# Patient Record
Sex: Female | Born: 1965 | Hispanic: No | Marital: Married | Smoking: Never smoker
Health system: Southern US, Community
[De-identification: ages and names within clinical notes are randomized; demographics above are authoritative.]

## PROBLEM LIST (undated history)

## (undated) DIAGNOSIS — D649 Anemia, unspecified: Secondary | ICD-10-CM

## (undated) DIAGNOSIS — J45909 Unspecified asthma, uncomplicated: Secondary | ICD-10-CM

## (undated) DIAGNOSIS — D500 Iron deficiency anemia secondary to blood loss (chronic): Principal | ICD-10-CM

## (undated) DIAGNOSIS — N812 Incomplete uterovaginal prolapse: Secondary | ICD-10-CM

## (undated) DIAGNOSIS — D219 Benign neoplasm of connective and other soft tissue, unspecified: Secondary | ICD-10-CM

## (undated) DIAGNOSIS — M25461 Effusion, right knee: Secondary | ICD-10-CM

## (undated) DIAGNOSIS — N814 Uterovaginal prolapse, unspecified: Secondary | ICD-10-CM

## (undated) HISTORY — DX: Uterovaginal prolapse, unspecified: N81.4

## (undated) HISTORY — DX: Incomplete uterovaginal prolapse: N81.2

## (undated) HISTORY — PX: NO PAST SURGERIES: SHX2092

## (undated) HISTORY — DX: Iron deficiency anemia secondary to blood loss (chronic): D50.0

## (undated) HISTORY — DX: Effusion, right knee: M25.461

## (undated) HISTORY — DX: Benign neoplasm of connective and other soft tissue, unspecified: D21.9

## (undated) HISTORY — DX: Unspecified asthma, uncomplicated: J45.909

## (undated) HISTORY — DX: Anemia, unspecified: D64.9

---

## 2004-03-17 ENCOUNTER — Ambulatory Visit: Payer: Self-pay

## 2004-04-19 ENCOUNTER — Ambulatory Visit: Payer: Self-pay

## 2004-05-26 ENCOUNTER — Ambulatory Visit: Payer: Self-pay

## 2007-11-15 ENCOUNTER — Observation Stay: Payer: Self-pay | Admitting: Obstetrics and Gynecology

## 2007-11-16 ENCOUNTER — Inpatient Hospital Stay: Payer: Self-pay

## 2009-09-03 ENCOUNTER — Ambulatory Visit: Payer: Self-pay | Admitting: Internal Medicine

## 2009-12-29 ENCOUNTER — Ambulatory Visit: Payer: Self-pay | Admitting: Internal Medicine

## 2010-09-27 ENCOUNTER — Ambulatory Visit: Payer: Self-pay | Admitting: Obstetrics and Gynecology

## 2010-12-23 ENCOUNTER — Emergency Department: Payer: Self-pay | Admitting: Emergency Medicine

## 2011-09-18 ENCOUNTER — Other Ambulatory Visit: Payer: Self-pay | Admitting: Obstetrics and Gynecology

## 2011-09-18 LAB — HCG, QUANTITATIVE, PREGNANCY: Beta Hcg, Quant.: 843 m[IU]/mL — ABNORMAL HIGH

## 2011-09-18 LAB — URINALYSIS, COMPLETE
Bilirubin,UR: NEGATIVE
Glucose,UR: NEGATIVE mg/dL (ref 0–75)
Ketone: NEGATIVE
Squamous Epithelial: 3
WBC UR: 1 /HPF (ref 0–5)

## 2011-09-20 ENCOUNTER — Other Ambulatory Visit: Payer: Self-pay | Admitting: Obstetrics and Gynecology

## 2011-09-20 LAB — HCG, QUANTITATIVE, PREGNANCY: Beta Hcg, Quant.: 317 m[IU]/mL — ABNORMAL HIGH

## 2011-10-04 ENCOUNTER — Other Ambulatory Visit: Payer: Self-pay | Admitting: Obstetrics and Gynecology

## 2011-10-04 LAB — HCG, QUANTITATIVE, PREGNANCY: Beta Hcg, Quant.: 2 m[IU]/mL

## 2014-09-15 ENCOUNTER — Encounter: Payer: Self-pay | Admitting: *Deleted

## 2015-07-20 DIAGNOSIS — Z0000 Encounter for general adult medical examination without abnormal findings: Secondary | ICD-10-CM | POA: Diagnosis not present

## 2015-07-20 DIAGNOSIS — K5900 Constipation, unspecified: Secondary | ICD-10-CM | POA: Diagnosis not present

## 2015-07-20 DIAGNOSIS — D500 Iron deficiency anemia secondary to blood loss (chronic): Secondary | ICD-10-CM | POA: Diagnosis not present

## 2015-07-20 DIAGNOSIS — E785 Hyperlipidemia, unspecified: Secondary | ICD-10-CM | POA: Diagnosis not present

## 2015-08-18 ENCOUNTER — Ambulatory Visit: Payer: Self-pay | Admitting: Physician Assistant

## 2015-08-18 ENCOUNTER — Other Ambulatory Visit: Payer: Self-pay | Admitting: Physician Assistant

## 2015-08-18 ENCOUNTER — Encounter: Payer: Self-pay | Admitting: Physician Assistant

## 2015-08-18 ENCOUNTER — Ambulatory Visit
Admission: RE | Admit: 2015-08-18 | Discharge: 2015-08-18 | Disposition: A | Discharge status: Home / Self Care | Payer: 59 | Source: Ambulatory Visit | Attending: Physician Assistant | Admitting: Physician Assistant

## 2015-08-18 VITALS — BP 150/90 | HR 84 | Temp 99.1°F

## 2015-08-18 DIAGNOSIS — M79604 Pain in right leg: Secondary | ICD-10-CM

## 2015-08-18 DIAGNOSIS — M7989 Other specified soft tissue disorders: Secondary | ICD-10-CM | POA: Diagnosis not present

## 2015-08-18 MED ORDER — IBUPROFEN 600 MG PO TABS: 600.0000 mg | ORAL_TABLET | Freq: Three times a day (TID) | ORAL | Status: DC | PRN

## 2015-08-18 NOTE — Progress Notes (Signed)
S: c/o r knee swelling, sx for 3 weeks, but now r lower leg has started hurting too, no swelling in leg but is having pain in back of calf, worried bc she has been driving 1hour a day both ways to help with grandchildren, took otc ibuprofen which helped knee pain but not leg pain, denies feeling sick, knee has not been red or hot to touch, no known injury; nonsmoker, no bcp or hormone replacement therapy  O: vitals wnl, nad, r knee with swelling at medial aspect, full rom, ligaments appear intact, r calf and posterior knee tender to palpation in center where dvt would be located, n/v intact  A: acute knee and leg pain  P: [place] r lower leg stat, if neg use ibuprofen 600mg  tid with food for pain, gave pt neoprene sleeve

## 2015-08-30 DIAGNOSIS — M25561 Pain in right knee: Secondary | ICD-10-CM | POA: Diagnosis not present

## 2015-08-30 DIAGNOSIS — M25461 Effusion, right knee: Secondary | ICD-10-CM | POA: Diagnosis not present

## 2015-09-01 DIAGNOSIS — S83421A Sprain of lateral collateral ligament of right knee, initial encounter: Secondary | ICD-10-CM | POA: Diagnosis not present

## 2015-09-01 DIAGNOSIS — M1711 Unilateral primary osteoarthritis, right knee: Secondary | ICD-10-CM | POA: Diagnosis not present

## 2015-09-02 ENCOUNTER — Ambulatory Visit: Payer: 59 | Attending: Student | Admitting: Physical Therapy

## 2015-09-02 DIAGNOSIS — R2689 Other abnormalities of gait and mobility: Secondary | ICD-10-CM | POA: Insufficient documentation

## 2015-09-02 DIAGNOSIS — M25669 Stiffness of unspecified knee, not elsewhere classified: Secondary | ICD-10-CM

## 2015-09-02 DIAGNOSIS — M24669 Ankylosis, unspecified knee: Secondary | ICD-10-CM | POA: Diagnosis not present

## 2015-09-02 NOTE — Patient Instructions (Signed)
All exercises provided were adapted from [email or web address]. Patient was provided a written handout with pictures as described. Any additional cues were manually entered in to handout and copied in to this document.  GREAT TOE EXTENSION STRETCH  While seated, cross your legs so that the affected leg is on top.   Next, bend your big toe back with your fingers until a stretch is felt in your toe and or bottom of your foot.   SEATED CALF STRETCH - GASTROC  While sitting, use a towel or other strap looped around your foot. Gently pull your ankle back until a stretch is felt along the back of your lower leg.   Prone Quadriceps Stretch  Lie on your stomach; hook a strap around your foot/ankle.  Gently pull your heel towards your bottom until a stretch is felt in the front of the thigh.  Keep your back relaxed and your hips flat on the table.

## 2015-09-03 NOTE — Therapy (Signed)
[facility] [address], [place], [address] Phone: [phone]   Fax:  [phone]  Physical Therapy Evaluation  Patient Details  Name: Connie Santiago MRN: [id number] Date of Birth: 1965/08/06 No Data Recorded  Encounter Date: 09/02/2015      PT End of Session - 09/03/15 2055    Visit Number 1   Number of Visits 13   Date for PT Re-Evaluation 10/22/15   PT Start Time [id number]   PT Stop Time 1615   PT Time Calculation (min) 60 min   Activity Tolerance Patient tolerated treatment well   Behavior During Therapy [facility] for tasks assessed/performed      No past medical history on file.  No past surgical history on file.  There were no vitals filed for this visit.  Visit Diagnosis:  Decreased range of motion (ROM) of knee - Plan: PT plan of care cert/re-cert  Other abnormalities of gait and mobility - Plan: PT plan of care cert/re-cert      Subjective Assessment - 09/02/15 1640    Subjective Patient reports she began limping roughly 5-6 weeks ago and noticed swelling in her suprapatellar region. She denies any pain in her knee, though notes her gait is still off. She does no recall any MOI.    Pertinent History Patient reports she may have had a fall out of bed or lifted a patient the wrong way several weeks ago. X-ray and [place] have been negative thus far. She reports she had some foot pain recently after changing shoes.    Limitations Walking   Diagnostic tests X-ray (notable for OA), [place] - negative for DVT    Patient Stated Goals To return to her walking around the track   Currently in Pain? No/denies            [facility] PT Assessment - 09/02/15 1627    Assessment   Medical Diagnosis --  LCL tear, OA   Referring Provider --  Cameron Proud   Precautions   Precautions None   Required Braces or Orthoses --  She was issued a brace, no comments on need?   Restrictions   Weight Bearing Restrictions  No   Balance Screen   Has the patient fallen in the past 6 months No  She reports she may have had a fall out of bed?    Prior Function   Level of Independence Independent   Vocation Full time employment   Vocation Requirements --  CCU RN   Leisure --  Likes walking on a track   Cognition   Overall Cognitive Status Within Functional Limits for tasks assessed   Observation/Other Assessments   Lower Extremity Functional Scale  56   Observation/Other Assessments-Edema    Edema --  Palpable swelling on the suprapatellar region, could not mea   Sensation   Light Touch Appears Intact   Functional Tests   Functional tests Squat;Sit to Stand   Sit to Stand   Comments --  Initially takes weight off, some valgus collapse noted cuing   Strength   Right Hip Flexion 5/5   Right Hip ABduction 4-/5   Right Hip ADduction 3+/5   Left Hip Flexion 5/5   Left Hip ABduction 5/5   Left Hip ADduction 5/5   Right Knee Flexion 5/5   Right Knee Extension 5/5   Left Knee Flexion 5/5   Left Knee Extension 5/5   Right Ankle Dorsiflexion 5/5   Right  Ankle Plantar Flexion 5/5   Right Ankle Inversion 5/5   Right Ankle Eversion 5/5   Left Ankle Dorsiflexion 5/5   Left Ankle Plantar Flexion 5/5   Left Ankle Inversion 5/5   Left Ankle Eversion 5/5   Palpation   Patella mobility --  Decreased ROM medial/lateral with mild reproduction of sympt   Palpation comment --  No tenderness discernable in adductors, quads, IT band   Slump test   Findings Negative   Ely's Test   Findings Positive   Side Right   Hip Scouring   Findings Negative   Ambulation/Gait   Gait Pattern --  Lateral whip, decreased great toe extension/DF, no TKE     TherEx Prone stretching of knee extensors in Ely's position 3 sets x 10 repetitions for 5" holds -- patient reported significant improvement in gait pattern sensation  Medial patellar mobilizaitons 3 bouts x 10 repetitions (again patient reported significant  improvement in gait sensation)  Great toe/dorsiflexor mobilizations by PT 3 bouts x 1' with decreased feeling of stiffness in gait  Self ankle/great toe DF mobilizations x 2 bouts for 1', patient felt worse than PT mobilizations provided.                       PT Education - 09/02/15 1626    Education provided Yes   Education Details HEP, RICE over the weekend, activity modification.    Person(s) Educated Patient   Methods Explanation;Demonstration   Comprehension Verbalized understanding;Returned demonstration             PT Long Term Goals - 09/03/15 2048    PT LONG TERM GOAL #1   Title Patient will report she is able to return to full walking program she has started to return to fitness activities.    Baseline Unable due to discomfort with ambulation.    Time 6   Period Weeks   Status New   PT LONG TERM GOAL #2   Title Patient will demonstrate 5/5 MMT strength in all directions bilaterally to reduce stress on knee joint in ADLs.    Time 6   Period Weeks   Status New   PT LONG TERM GOAL #3   Title Patient will report an LEFS score of greater than 65/80 to demonstrate improved ability to complete ADLs.    Baseline 56/80   Time 6   Period Weeks   Status New               Plan - 09/02/15 1623    Clinical Impression Statement Patient reports acute onset of distorted gait pattern and swelling in her R knee. She recalls no MOI and on this exam does not demonstrate any laxity of the knee as assessed by this provider. She does demonstrate decreased ROM around her knee into flexion, decreased paterllar mobility, and weakness around her hip. She would benefit from skilled PT services to treat identified impairments to return to normal activities.    Pt will benefit from skilled therapeutic intervention in order to improve on the following deficits Abnormal gait;Decreased range of motion   Rehab Potential Good   Clinical Impairments Affecting Rehab  Potential Acute onset, no pain, quite active at baseline.    PT Frequency 2x / week   PT Duration 6 weeks   PT Treatment/Interventions Aquatic Therapy;Cryotherapy;Taping;Manual techniques;Therapeutic exercise;Therapeutic activities;Gait training   PT Next Visit Plan Assess Ely's test, mensical testing, joint laxity, increase ankle DF ROM, great toe extension, knee  flexion mobilizations    PT Home Exercise Plan See patient instructions.    Consulted and Agree with Plan of Care Patient         Problem List There are no active problems to display for this patient.  Kerman Passey, PT, DPT    09/03/2015, 8:57 PM  [place] [address] [place], [address] Phone: [phone]   Fax:  [phone]  Name: ELENOA SPRAGGINS MRN: [id number] Date of Birth: 02/14/1966

## 2015-09-07 ENCOUNTER — Ambulatory Visit: Payer: 59 | Attending: Student | Admitting: Physical Therapy

## 2015-09-07 DIAGNOSIS — R2689 Other abnormalities of gait and mobility: Secondary | ICD-10-CM | POA: Diagnosis not present

## 2015-09-07 DIAGNOSIS — M25669 Stiffness of unspecified knee, not elsewhere classified: Secondary | ICD-10-CM

## 2015-09-07 DIAGNOSIS — M24669 Ankylosis, unspecified knee: Secondary | ICD-10-CM | POA: Diagnosis not present

## 2015-09-07 NOTE — Therapy (Signed)
[facility] [address] [place], [address] Phone: [phone]   Fax:  [phone]  Physical Therapy Treatment  Patient Details  Name: LEKSI HERTZBERG MRN: [id number] Date of Birth: 1966/05/13 No Data Recorded  Encounter Date: 09/07/2015      PT End of Session - 09/07/15 0850    Visit Number 2   Number of Visits 13   Date for PT Re-Evaluation 10/22/15   PT Start Time 0800   PT Stop Time 0845   PT Time Calculation (min) 45 min   Activity Tolerance Patient tolerated treatment well   Behavior During Therapy [facility] for tasks assessed/performed      No past medical history on file.  No past surgical history on file.  There were no vitals filed for this visit.  Visit Diagnosis:  Decreased range of motion (ROM) of knee  Other abnormalities of gait and mobility      Subjective Assessment - 09/07/15 0827    Subjective Patient reports she was able to go up stair yesterday which she had not been able to do, she reports she is still having swelling (though some decrease). She reports less stiffness without the brace on, increased stiffness/discomfort with brace on.    Pertinent History Patient reports she may have had a fall out of bed or lifted a patient the wrong way several weeks ago. X-ray and [place] have been negative thus far. She reports she had some foot pain recently after changing shoes.    Limitations Walking   Diagnostic tests X-ray (notable for OA), [place] - negative for DVT    Patient Stated Goals To return to her walking around the track   Currently in Pain? No/denies  Feeling of stretching in posterior and anterior knee.      Mobilizations over bolster 5 bounds with increasing knee flexion she reports some stretching sensation with progressive knee flexion  Quad sets with knee extension mobs 5 bouts x 30" (unable to achieve full ROM) patient reported significant difference side to side,  pain/discomfort noted with end range knee extension.   Figure 4 positioning in standing (PT assisting for active HS activity to increase knee flexion ROM) 3 bouts x 15 repetitions  Standing TKE with over pressure 2 bouts x 10 repetitions with PT providing overpressure (well tolerated)  Gait assessment -- decreased medial whip, appears to be related to decreased knee flexion in swing phase. Improved with cuing for increased knee flexion, patient reported this was the best her walking had felt in weeks.   Standing calf raises -- 2 sets of 12 repetitions with HHA (had noticed some atrophy in LLE calf musculature)                            PT Education - 09/07/15 0849    Education provided Yes   Education Details HEP including increased knee flexion/extension. Increased knee flexion inambulaiton.    Person(s) Educated Patient   Methods Explanation;Demonstration;Handout   Comprehension Returned demonstration;Verbalized understanding             PT Long Term Goals - 09/03/15 2048    PT LONG TERM GOAL #1   Title Patient will report she is able to return to full walking program she has started to return to fitness activities.    Baseline Unable due to discomfort with ambulation.    Time 6   Period Weeks   Status  New   PT LONG TERM GOAL #2   Title Patient will demonstrate 5/5 MMT strength in all directions bilaterally to reduce stress on knee joint in ADLs.    Time 6   Period Weeks   Status New   PT LONG TERM GOAL #3   Title Patient will report an LEFS score of greater than 65/80 to demonstrate improved ability to complete ADLs.    Baseline 56/80   Time 6   Period Weeks   Status New               Plan - 09/07/15 [id number]    Clinical Impression Statement Patient demonstrates increased knee flexion once cued to increase during session. After mobilizations and active HS/qaudricep activity in standing she reports her ambulaiton feels the best it has in  weeks. She continues to have suprapatellar swelling, though this appears to be decreasing as well. She continues to demonstrate decreased ROM around her knee both actively and passively.   Pt will benefit from skilled therapeutic intervention in order to improve on the following deficits Abnormal gait;Decreased range of motion   Rehab Potential Good   Clinical Impairments Affecting Rehab Potential Acute onset, no pain, quite active at baseline.    PT Frequency 2x / week   PT Duration 6 weeks   PT Treatment/Interventions Aquatic Therapy;Cryotherapy;Taping;Manual techniques;Therapeutic exercise;Therapeutic activities;Gait training   PT Next Visit Plan Assess Ely's test, mensical testing, joint laxity, increase ankle DF ROM, great toe extension, knee flexion mobilizations    PT Home Exercise Plan See patient instructions.    Consulted and Agree with Plan of Care Patient        Problem List There are no active problems to display for this patient.  Kerman Passey, PT, DPT    09/07/2015, 8:32 PM  [place] [place] [facility] [address] [place], [address] Phone: [phone]   Fax:  [phone]  Name: VANI BORGESE MRN: [id number] Date of Birth: 10-16-1965

## 2015-09-14 ENCOUNTER — Ambulatory Visit: Payer: 59 | Admitting: Physical Therapy

## 2015-09-14 DIAGNOSIS — R2689 Other abnormalities of gait and mobility: Secondary | ICD-10-CM

## 2015-09-14 DIAGNOSIS — M24669 Ankylosis, unspecified knee: Secondary | ICD-10-CM | POA: Diagnosis not present

## 2015-09-14 DIAGNOSIS — M25669 Stiffness of unspecified knee, not elsewhere classified: Secondary | ICD-10-CM

## 2015-09-14 NOTE — Therapy (Signed)
[facility] [address], [place], [address] Phone: [phone]   Fax:  [phone]  Physical Therapy Treatment  Patient Details  Name: Connie Santiago MRN: [id number] Date of Birth: Nov 12, 1965 No Data Recorded  Encounter Date: 09/14/2015      PT End of Session - 09/14/15 0847    Visit Number 3   Number of Visits 13   Date for PT Re-Evaluation 10/22/15   PT Start Time 0801   PT Stop Time [id number]   PT Time Calculation (min) 43 min   Activity Tolerance Patient tolerated treatment well   Behavior During Therapy [facility] for tasks assessed/performed      No past medical history on file.  No past surgical history on file.  There were no vitals filed for this visit.      Subjective Assessment - 09/14/15 0803    Subjective Patient reports she had some transient pain on the medial side of her R knee on Sunday morning when she woke up. Patient reports she feels much better, reduced swelling noted. Reports she has less "catching" sensation in her knee.    Pertinent History Patient reports she may have had a fall out of bed or lifted a patient the wrong way several weeks ago. X-ray and [place] have been negative thus far. She reports she had some foot pain recently after changing shoes.    Limitations Walking   Diagnostic tests X-ray (notable for OA), [place] - negative for DVT    Patient Stated Goals To return to her walking around the track   Currently in Pain? No/denies      Rock backs to activate quad 2 rounds x 15 repetitions with patient reporting she is able to feel quadricep activating.   Leg press 15, 10, 5 (unable to tolerate higher weights) for 10 repetitions at 10# and 5# on RLE only (patient required max cuing to not provide movement from lumbar spine, just from knee flexion/extension.   LAQs (only felt in inferior patellar region) attempted with ankle weight, against manual resistance, and against  bodyweight.   Standing TKEs - with red theraband to promote knee extensor activation. Compared sides, R side shows decreased definition (either from atrophy or swelling) 2 bouts x 15 repetitions (able to provide light quadricep activation.   Prone knee flexion manual stretching bilaterally for 3 bouts x 8 repetitions for 15"   SLRs - able to feel quad contraction. Able to complete 10 repetitions. On RLE.   Gait assessment- patient continues to demonstrate decreased knee flexion to initiate swing phase and is unable to land in knee extension due to quad weakness currently.                            PT Education - 09/14/15 [phone]    Education provided Yes   Education Details Need to re-activate quadriceps, HEP to include SLRs and rock backs which activated her quad.    Person(s) Educated Patient   Methods Explanation;Demonstration;Handout   Comprehension Verbalized understanding;Returned demonstration             PT Long Term Goals - 09/03/15 2048    PT LONG TERM GOAL #1   Title Patient will report she is able to return to full walking program she has started to return to fitness activities.    Baseline Unable due to discomfort with ambulation.    Time 6   Period  Weeks   Status New   PT LONG TERM GOAL #2   Title Patient will demonstrate 5/5 MMT strength in all directions bilaterally to reduce stress on knee joint in ADLs.    Time 6   Period Weeks   Status New   PT LONG TERM GOAL #3   Title Patient will report an LEFS score of greater than 65/80 to demonstrate improved ability to complete ADLs.    Baseline 56/80   Time 6   Period Weeks   Status New               Plan - 09/14/15 0847    Clinical Impression Statement Patient now reporting decreased swelling and improved gait. She continues to demonstrate poor quadricep activation, and is unable to perform LAQ without pain in patellar tendon. She reports her gait feels the best it has upon leaving  session. She demonstrates increased R knee flexion with swing phase, though still mildly decreased from LLE.    Rehab Potential Good   Clinical Impairments Affecting Rehab Potential Acute onset, no pain, quite active at baseline.    PT Frequency 2x / week   PT Duration 6 weeks   PT Treatment/Interventions Aquatic Therapy;Cryotherapy;Taping;Manual techniques;Therapeutic exercise;Therapeutic activities;Gait training   PT Next Visit Plan Increase knee flexion, progress quad activation (e-stim?, TENS?) as tolerated and able.    PT Home Exercise Plan SLRs and rock backs to activate quadriceps.    Consulted and Agree with Plan of Care Patient      Patient will benefit from skilled therapeutic intervention in order to improve the following deficits and impairments:  Abnormal gait, Decreased range of motion  Visit Diagnosis: Decreased range of motion (ROM) of knee  Other abnormalities of gait and mobility     Problem List There are no active problems to display for this patient.  Kerman Passey, PT, DPT    09/14/2015, 10:56 PM  [facility] [address] [place], [address] Phone: [phone]   Fax:  [phone]  Name: SHAUGHNESSY BRUECK MRN: [id number] Date of Birth: Jul 16, 1965

## 2015-09-21 ENCOUNTER — Ambulatory Visit: Payer: 59

## 2015-09-21 ENCOUNTER — Encounter: Payer: Self-pay | Admitting: Physical Therapy

## 2015-09-21 DIAGNOSIS — M24669 Ankylosis, unspecified knee: Secondary | ICD-10-CM | POA: Diagnosis not present

## 2015-09-21 DIAGNOSIS — R2689 Other abnormalities of gait and mobility: Secondary | ICD-10-CM

## 2015-09-21 DIAGNOSIS — M25669 Stiffness of unspecified knee, not elsewhere classified: Secondary | ICD-10-CM

## 2015-09-21 NOTE — Therapy (Signed)
[facility] [address] [place], [address] Phone: [phone]   Fax:  [phone]  Physical Therapy Treatment  Patient Details  Name: Connie Santiago MRN: [id number] Date of Birth: 08-17-1965 No Data Recorded  Encounter Date: 09/21/2015      PT End of Session - 09/21/15 1016    Visit Number 4   Number of Visits 13   Date for PT Re-Evaluation 10/22/15   PT Start Time 0805   PT Stop Time 0845   PT Time Calculation (min) 40 min   Activity Tolerance Patient tolerated treatment well   Behavior During Therapy [facility] for tasks assessed/performed      History reviewed. No pertinent past medical history.  History reviewed. No pertinent past surgical history.  There were no vitals filed for this visit.      Subjective Assessment - 09/21/15 0808    Subjective Pt reports that her R knee is improving. Overall she states that she believes she is 85% improved since starting therapy. Her gait is improving and she has less pain. HEP is going well. No specific questions or concerns at the moment.    Pertinent History Patient reports she may have had a fall out of bed or lifted a patient the wrong way several weeks ago. X-ray and [place] have been negative thus far. She reports she had some foot pain recently after changing shoes.    Limitations Walking   Diagnostic tests X-ray (notable for OA), [place] - negative for DVT    Patient Stated Goals To return to her walking around the track   Currently in Pain? No/denies      Treatment  Ther-ex Gait assessed in gym which has improved since initial evaluation. She continues to demonstrates lack for full R knee extension and diminished heel strike during gait, circumduction is less but still present. Decreased push off with RLE; Rock backs to activate quad 2 rounds x 15 repetitions with patient reporting she is able to feel quadricep activating; Assessed hamstring length and  appears grossly WFL, unable to passively fully extend the R knee; Assessed patellar mobility, medial border of patella appears to be elevated compared to contralateral LE, painful patellar mobilizations; Suprapatellar swelling noted, no joint effusion noted with sweep test; RLE single leg bridges 2 x 10; RLE quad sets 3 x 10 with therapist hand under knee for tactile feedback for contraction; Assessed soft tissue swelling around suprapatellar region of R knee. No apparent effusion into the joint; Standing R TKE with red Tband 2 x 10; RLE single leg press 5# and 10# on RLE only (patient required max cuing to not provide movement from lumbar spine, just from knee flexion/extension.); RLE single leg isometric holds 10# on leg press machine, 10 second hold, 10 second relax x 5;  Manual Therapy Grade I-II tibia on femur AP and PA mobs in hooklying with R knee flexed to approximately 115 degrees, 30 second/bout x 3 each to reduce swelling;                           PT Education - 09/21/15 1016    Education provided Yes   Education Details Continue HEP   Person(s) Educated Patient   Methods Explanation;Demonstration   Comprehension Verbalized understanding;Returned demonstration             PT Long Term Goals - 09/03/15 2048    PT LONG TERM GOAL #1  Title Patient will report she is able to return to full walking program she has started to return to fitness activities.    Baseline Unable due to discomfort with ambulation.    Time 6   Period Weeks   Status New   PT LONG TERM GOAL #2   Title Patient will demonstrate 5/5 MMT strength in all directions bilaterally to reduce stress on knee joint in ADLs.    Time 6   Period Weeks   Status New   PT LONG TERM GOAL #3   Title Patient will report an LEFS score of greater than 65/80 to demonstrate improved ability to complete ADLs.    Baseline 56/80   Time 6   Period Weeks   Status New               Plan -  09/21/15 1017    Clinical Impression Statement Pt reporting considerable improvement since starting therapy. She continues to lack full R knee extension passively but appears close to neutral with active SLR. Muscle guarding and pain noted. She continues to demonstrate gait abnormalities but improving. Pt encouraged to continue HEP and follow-up as scheduled.    Rehab Potential Good   Clinical Impairments Affecting Rehab Potential Acute onset, no pain, quite active at baseline.    PT Frequency 2x / week   PT Duration 6 weeks   PT Treatment/Interventions Aquatic Therapy;Cryotherapy;Taping;Manual techniques;Therapeutic exercise;Therapeutic activities;Gait training   PT Next Visit Plan Increase knee flexion, progress quad activation (e-stim?, TENS?) as tolerated and able.    PT Home Exercise Plan SLRs and rock backs to activate quadriceps.    Consulted and Agree with Plan of Care Patient      Patient will benefit from skilled therapeutic intervention in order to improve the following deficits and impairments:  Abnormal gait, Decreased range of motion  Visit Diagnosis: Decreased range of motion (ROM) of knee  Other abnormalities of gait and mobility     Problem List There are no active problems to display for this patient.  Phillips Grout PT, DPT   Huprich,Jason 09/21/2015, 10:18 AM  [place] [facility] [address] [place], [address] Phone: [phone]   Fax:  [phone]  Name: Connie Santiago MRN: [id number] Date of Birth: Oct 23, 1965

## 2015-09-28 ENCOUNTER — Ambulatory Visit: Payer: 59 | Admitting: Physical Therapy

## 2015-10-05 ENCOUNTER — Encounter: Payer: Self-pay | Admitting: Physical Therapy

## 2015-10-05 ENCOUNTER — Ambulatory Visit: Payer: 59 | Attending: Student | Admitting: Physical Therapy

## 2015-10-05 DIAGNOSIS — M24669 Ankylosis, unspecified knee: Secondary | ICD-10-CM | POA: Diagnosis not present

## 2015-10-05 DIAGNOSIS — R2689 Other abnormalities of gait and mobility: Secondary | ICD-10-CM | POA: Diagnosis not present

## 2015-10-05 DIAGNOSIS — M25669 Stiffness of unspecified knee, not elsewhere classified: Secondary | ICD-10-CM

## 2015-10-07 NOTE — Therapy (Signed)
[place] [facility] [address] [place], [address] Phone: [phone]   Fax:  [phone]  Physical Therapy Treatment  Patient Details  Name: Connie Santiago MRN: [id number] Date of Birth: [birth year] No Data Recorded  Encounter Date: 10/05/2015      PT End of Session - 10/07/15 0911    Visit Number 5   Number of Visits 13   Date for PT Re-Evaluation 10/22/15   PT Start Time [id number]   PT Stop Time 1430   PT Time Calculation (min) 47 min   Activity Tolerance Patient tolerated treatment well   Behavior During Therapy [facility] for tasks assessed/performed      No past medical history on file.  No past surgical history on file.  There were no vitals filed for this visit.      Subjective Assessment - 10/07/15 0913    Subjective Patient reports she was able to ambulate a lap around the track, though slowly. She has been wearing her knee brace at work on occasion as she feels it takes her a long time to ambulate due to fear of catching sensation in her R knee.    Pertinent History Patient reports she may have had a fall out of bed or lifted a patient the wrong way several weeks ago. X-ray and [place] have been negative thus far. She reports she had some foot pain recently after changing shoes.    Limitations Walking   Diagnostic tests X-ray (notable for OA), [place] - negative for DVT    Patient Stated Goals To return to her walking around the track   Currently in Pain? --  Tightness/locking sensation with increased gait speed.       Gait assessment - initially presents with reduced circumduction than previous sessions. Noted to also land in R knee flexion at heel strike, L knee extended in heel strike.   Calf stretching manually provided 3 bouts (seemed to improve symptoms with fast walking, which she was able to tolerate moreso today).   Stair calf stretch 2 bouts x 8 repetitions (increased pain/stiffness with fast  walking)  SLS on blue foam pad with and w/o HHA to promote knee extension in stance 2 bouts x 10 repetitions for 2" holds   Knee flexion mobs -- while sitting at EOB and leg dangling, patient reported improved knee flexion in toe off to mid swing afterwards  Knee extension mobs 2 bouts x 30" (uncomfortable, but tolerable grade I-II -- reduced circumduction gait)  Soft tissue mobilization provided to calf reduced symptoms of tightness/tenderness in her calf with gait.   Gait assessment - at one point in session increased circumduction after more aggressive calf stretching, decreased to baseline after additional treatments provided in this session. Reports she feels most limited in flexion during mid-swing.                            PT Education - 10/07/15 0912    Education provided Yes   Education Details Ambulate around track, increase gait speed, stretching program.    Person(s) Educated Patient   Methods Explanation;Demonstration   Comprehension Verbalized understanding;Returned demonstration             PT Long Term Goals - 09/03/15 2048    PT LONG TERM GOAL #1   Title Patient will report she is able to return to full walking program she has started to return to fitness  activities.    Baseline Unable due to discomfort with ambulation.    Time 6   Period Weeks   Status New   PT LONG TERM GOAL #2   Title Patient will demonstrate 5/5 MMT strength in all directions bilaterally to reduce stress on knee joint in ADLs.    Time 6   Period Weeks   Status New   PT LONG TERM GOAL #3   Title Patient will report an LEFS score of greater than 65/80 to demonstrate improved ability to complete ADLs.    Baseline 56/80   Time 6   Period Weeks   Status New               Plan - 10/07/15 0911    Clinical Impression Statement Patient demonstrating less circumduction initially, appeared to be flared up by more aggressive calf stretching, and reduced with  knee extension/flexion mobilizations as well as soft tissue mobilization provided to calf musculature. She reports she feels most limited now by decreased knee flexion due to intemittent catchinglocking in mid swing to heel strike.    Rehab Potential Good   Clinical Impairments Affecting Rehab Potential Acute onset, no pain, quite active at baseline.    PT Frequency 2x / week   PT Duration 6 weeks   PT Treatment/Interventions Aquatic Therapy;Cryotherapy;Taping;Manual techniques;Therapeutic exercise;Therapeutic activities;Gait training   PT Next Visit Plan Increase knee flexion, progress quad activation (e-stim?, TENS?) as tolerated and able.    PT Home Exercise Plan SLRs and rock backs to activate quadriceps.    Consulted and Agree with Plan of Care Patient      Patient will benefit from skilled therapeutic intervention in order to improve the following deficits and impairments:  Abnormal gait, Decreased range of motion  Visit Diagnosis: Decreased range of motion (ROM) of knee  Other abnormalities of gait and mobility     Problem List There are no active problems to display for this patient.  Kerman Passey, PT, DPT    10/07/2015, 9:13 AM  [place] [facility] [address] [place], [address] Phone: [phone]   Fax:  [phone]  Name: Connie Santiago MRN: [id number] Date of Birth: 02/16/1966

## 2015-10-12 ENCOUNTER — Ambulatory Visit: Payer: 59 | Admitting: Physical Therapy

## 2015-10-12 DIAGNOSIS — M25669 Stiffness of unspecified knee, not elsewhere classified: Secondary | ICD-10-CM

## 2015-10-12 DIAGNOSIS — M24669 Ankylosis, unspecified knee: Secondary | ICD-10-CM | POA: Diagnosis not present

## 2015-10-12 DIAGNOSIS — R2689 Other abnormalities of gait and mobility: Secondary | ICD-10-CM | POA: Diagnosis not present

## 2015-10-12 NOTE — Therapy (Signed)
[place] [facility] [address], [place], [address] Phone: [phone]   Fax:  [phone]  Physical Therapy Treatment  Patient Details  Name: Connie Santiago MRN: [id number] Date of Birth: 10/22/1965 No Data Recorded  Encounter Date: 10/12/2015      PT End of Session - 10/12/15 1144    Visit Number 6   Number of Visits 13   Date for PT Re-Evaluation 10/22/15   PT Start Time 0801   PT Stop Time 0845   PT Time Calculation (min) 44 min   Activity Tolerance Patient tolerated treatment well   Behavior During Therapy [facility] for tasks assessed/performed      No past medical history on file.  No past surgical history on file.  There were no vitals filed for this visit.      Subjective Assessment - 10/12/15 0805    Subjective (p) Patient reports she has continued to have trouble with stiffness around her knee, occasional medial knee pain continues. She is having catches (2 last night) which she had not had in a while. She reports she is considering getting a second opinion.    Pertinent History (p) Patient reports she may have had a fall out of bed or lifted a patient the wrong way several weeks ago. X-ray and [place] have been negative thus far. She reports she had some foot pain recently after changing shoes.    Limitations (p) Walking   Diagnostic tests (p) X-ray (notable for OA), [place] - negative for DVT    Patient Stated Goals (p) To return to her walking around the track   Currently in Pain? (p) Yes   Pain Score (p) --  Mild medial knee pain - "feels like something you shouldn't be feeling".       Rock backs to initiate quad contraction x 15 with RLE posterior to LLE. Unable to elicit good contraction. (2 sets of this with PT overpressure on 2nd set). Began to be tight/painful.  Gait assessment initially- patient demonstrates circumduction and decreased knee extension throughout gait cycle.   E-stim x 11  minutes at 28 mA on russian stim 10/10 setting over medial and lateral distal quadricep on RLE with PT assisting in 3, 3" SAQs to increase quadricep firing rate. During subsequent gait bout patient reported "100%" improvement in symptoms from initial portion of today's session. She is noted to have less circumduction, however continues to land with knee flexed. She reports she feels that her RLE is shorter (which it is functionally with her knee bent). Hot pack applied underneath knee to relax hamstrings (which were tight on plapation).                           PT Education - 10/12/15 1143    Education provided Yes   Education Details That she can continue with PT, but would benefit from a second opinion or additional imaging.    Person(s) Educated Patient   Methods Explanation   Comprehension Verbalized understanding             PT Long Term Goals - 09/03/15 2048    PT LONG TERM GOAL #1   Title Patient will report she is able to return to full walking program she has started to return to fitness activities.    Baseline Unable due to discomfort with ambulation.    Time 6   Period Weeks   Status New  PT LONG TERM GOAL #2   Title Patient will demonstrate 5/5 MMT strength in all directions bilaterally to reduce stress on knee joint in ADLs.    Time 6   Period Weeks   Status New   PT LONG TERM GOAL #3   Title Patient will report an LEFS score of greater than 65/80 to demonstrate improved ability to complete ADLs.    Baseline 56/80   Time 6   Period Weeks   Status New               Plan - 10/12/15 1144    Clinical Impression Statement Patient reports significant improvement in her reported symptoms today, however she continues to circumduct and have a catching sensation while walking. After several weeks of conservative treatment she has certainly improved her gait pattern, but continues to have intermittent pain/catching and would likely benefit from a  second medical opionion. She is having significant difficulty generating a R quadriceps contraction due to swelling it appears.    Rehab Potential Good   Clinical Impairments Affecting Rehab Potential Acute onset, no pain, quite active at baseline.    PT Frequency 2x / week   PT Duration 6 weeks   PT Treatment/Interventions Aquatic Therapy;Cryotherapy;Taping;Manual techniques;Therapeutic exercise;Therapeutic activities;Gait training   PT Next Visit Plan E-stim on quad, HS stretching for R LE   PT Home Exercise Plan SLRs and rock backs to activate quadriceps.    Consulted and Agree with Plan of Care Patient      Patient will benefit from skilled therapeutic intervention in order to improve the following deficits and impairments:  Abnormal gait, Decreased range of motion  Visit Diagnosis: Decreased range of motion (ROM) of knee  Other abnormalities of gait and mobility     Problem List There are no active problems to display for this patient.  Kerman Passey, PT, DPT    10/12/2015, 11:48 AM  [place] [facility] [address] [place], [address] Phone: [phone]   Fax:  [phone]  Name: Connie Santiago MRN: [id number] Date of Birth: [birth year]

## 2015-10-19 ENCOUNTER — Ambulatory Visit: Payer: 59 | Admitting: Physical Therapy

## 2015-10-19 DIAGNOSIS — M24669 Ankylosis, unspecified knee: Secondary | ICD-10-CM | POA: Diagnosis not present

## 2015-10-19 DIAGNOSIS — R2689 Other abnormalities of gait and mobility: Secondary | ICD-10-CM

## 2015-10-19 DIAGNOSIS — M25669 Stiffness of unspecified knee, not elsewhere classified: Secondary | ICD-10-CM

## 2015-10-19 NOTE — Therapy (Signed)
[place] [facility] [address] [place], [address] Phone: [phone]   Fax:  [phone]  Physical Therapy Treatment  Patient Details  Name: Connie Santiago MRN: [id number] Date of Birth: 10-08-1965 No Data Recorded  Encounter Date: 10/19/2015      PT End of Session - 10/19/15 0906    Visit Number 7   Number of Visits 13   Date for PT Re-Evaluation 10/22/15   PT Start Time 0803   PT Stop Time 0842   PT Time Calculation (min) 39 min   Activity Tolerance Patient tolerated treatment well   Behavior During Therapy [facility] for tasks assessed/performed      No past medical history on file.  No past surgical history on file.  There were no vitals filed for this visit.      Subjective Assessment - 10/19/15 0915    Subjective Patient reports she has had some increased pain in medial R knee after long days of ambulation, does not appear to be progressing as she would like. She reports she continues to have stiffness around the knee and altered gait pattern with palpable swelling. She has increased her activity levels and ability to ascend/descend stairs from baseline with PT.   Pertinent History Patient reports she may have had a fall out of bed or lifted a patient the wrong way several weeks ago. X-ray and [place] have been negative thus far. She reports she had some foot pain recently after changing shoes.    Diagnostic tests X-ray (notable for OA), [place] - negative for DVT    Patient Stated Goals To return to her walking around the track   Currently in Pain? No/denies  Some medial knee pain and clicking while doing prolonged ambulation.      R quad 4+/5, knee flexors 4/5, hip flexors - felt pull in distal quadricep region (4+/5) - 5/5 at ankles.   Lacking 5 degrees extension on RLE, comfortable resting position 20 degrees flexion, 100 degrees maximal flexion comfortably (125 degrees on LLE) and 120 degrees with PT  overpressure    Lateral step ups 3 sets x 12 on 3rd set PT provided overpressure to distal quad anterior to posterior and proximal calf posterior to anterior felt a click/movement and discontinued additional over pressure. No added pain afterwards. Continued to perform x 7 repetitions with no further complaints. Reported she felt this one was "working muscles that needed to be worked" and felt good. Provided for HEP.   PT discussed noted changes with patient. Specifically gait pattern with less appreciation of circumduction/whip. Patient has also noted ability to complete steps (though not as prior to the onset of this "injury") and has increased her step count from 2-3,000 to over 7,000 now.   PT continued to appreciate decreased quadricep contraction in quad setting in standing on R relative to L, R sided to palpation appeared to have more atrophy and/or swelling as L side felt much firmer. No obvious Baker's cyst appreciated on RLE. She continues to present with unilateral genu valgus on RLE.   LEFS score - 53/80                           PT Education - 10/19/15 0904    Education provided Yes   Education Details PT educated patient on changes since baseline as well as observations from baseline that have not changed. PT appears to be plateauing with patient,  and PT recommended follow up for second opinion if she desired.    Person(s) Educated Patient   Methods Explanation;Handout   Comprehension Verbalized understanding             PT Long Term Goals - 10/19/15 0907    PT LONG TERM GOAL #1   Title Patient will report she is able to return to full walking program she has started to return to fitness activities.    Baseline Unable due to discomfort with ambulation. (at baseline). On 5/16 she is able to intermittently participate with walking group, though if she walks more throughout the day she develops some pain.    Time 6   Period Weeks   Status Partially Met    PT LONG TERM GOAL #2   Title Patient will demonstrate 5/5 MMT strength in all directions bilaterally to reduce stress on knee joint in ADLs.    Baseline Demonstrating lack of strength in quad, hamstring muscle groups on RLE    Time 6   Period Weeks   Status Not Met   PT LONG TERM GOAL #3   Title Patient will report an LEFS score of greater than 65/80 to demonstrate improved ability to complete ADLs.    Baseline 56/80 at intake, 53/80 on 5/16   Time 6   Period Weeks   Status Not Met               Plan - 10/19/15 0907    Clinical Impression Statement Patient continues to present with swelling in R knee with reports of catching sensation intermittently and pain developing in medial knee. She demonstrates decreased whip/circumduction in gait today, however her progress appears to be plateauing as she is having difficulty maintaining HEP, and continues to demonstrate genu valgus on RLE. She reports she continues to notice changes from baseline such as knees rubbing together, tighter fit to R side of pants than L due to swelling. In light of her continued complaints and stagnating response to therapy, recommend patient follow up with orthopedic provider for further evaluation.    Rehab Potential Good   Clinical Impairments Affecting Rehab Potential Acute onset, no pain, quite active at baseline.    PT Frequency 2x / week   PT Duration 6 weeks   PT Treatment/Interventions Aquatic Therapy;Cryotherapy;Taping;Manual techniques;Therapeutic exercise;Therapeutic activities;Gait training   PT Next Visit Plan Discharge instructions to follow up with orthopedist.    PT Home Exercise Plan SLRs and rock backs to activate quadriceps.    Consulted and Agree with Plan of Care Patient      Patient will benefit from skilled therapeutic intervention in order to improve the following deficits and impairments:  Abnormal gait, Decreased range of motion  Visit Diagnosis: Decreased range of motion (ROM)  of knee  Other abnormalities of gait and mobility     Problem List There are no active problems to display for this patient.  Kerman Passey, PT, DPT    10/19/2015, 12:52 PM  [place] [facility] [address] [place], [address] Phone: [phone]   Fax:  [phone]  Name: Connie Santiago MRN: [id number] Date of Birth: 02/08/1966

## 2016-03-29 ENCOUNTER — Encounter: Payer: Self-pay | Admitting: Obstetrics and Gynecology

## 2016-03-29 ENCOUNTER — Ambulatory Visit (INDEPENDENT_AMBULATORY_CARE_PROVIDER_SITE_OTHER): Payer: 59 | Admitting: Obstetrics and Gynecology

## 2016-03-29 VITALS — BP 134/87 | HR 90 | Ht 63.0 in | Wt 156.1 lb

## 2016-03-29 DIAGNOSIS — R590 Localized enlarged lymph nodes: Secondary | ICD-10-CM | POA: Diagnosis not present

## 2016-03-29 DIAGNOSIS — N644 Mastodynia: Secondary | ICD-10-CM | POA: Diagnosis not present

## 2016-03-29 NOTE — Progress Notes (Signed)
GYN ENCOUNTER NOTE  Subjective:       Connie Santiago is a 50 y.o. [phone] female is here for gynecologic evaluation of the following issues:  1. Bilateral breast pain 2. Right axillary adenopathy.    2 month history of symptoms. No recent trauma. No prior history of mammography. Family history notable for breast cancer in 2 nieces at a young age (mother's brother's daughters 2). no known history of other breast disease in family. Unknown if BRCA testing was performed in maternal uncles family. Patient denies nipple discharge. Tenderness is bilateral and uncomfortable with wearing bras. Holding grandchildren also exacerbate symptoms.   Gynecologic History Patient's last menstrual period was 03/04/2016 (exact date). Contraception: none  Obstetric History OB History  Gravida Para Term Preterm AB Living  8 7 7   1 7   SAB TAB Ectopic Multiple Live Births  1       7    # Outcome Date GA Lbr Len/2nd Weight Sex Delivery Anes PTL Lv  8 SAB 2013          7 Term 2009   8 lb (3.629 kg) M Vag-Spont   LIV  6 Term 2002   8 lb 9.6 oz (3.901 kg) M Vag-Spont   LIV  5 Term 1996   6 lb 1.9 oz (2.776 kg) F Vag-Spont   LIV  4 Term 1995   7 lb 11.2 oz (3.493 kg) M Vag-Spont   LIV  3 Term 1992   6 lb (2.722 kg) F Vag-Spont   LIV  2 Term 1990   7 lb (3.175 kg) M Vag-Spont   LIV  1 Term 1989   6 lb (2.722 kg) F Vag-Spont   LIV      Past Medical History:  Diagnosis Date  . Anemia   . Asthma in child   . Cervical prolapse   . Fibroid   . Swollen R knee     Past Surgical History:  Procedure Laterality Date  . NO PAST SURGERIES      No current outpatient prescriptions on file prior to visit.   No current facility-administered medications on file prior to visit.     Allergies  Allergen Reactions  . Erythromycin   . Penicillins     Social History   Social History  . Marital status: Married    Spouse name: N/A  . Number of children: N/A  . Years of education: N/A    Occupational History  . Not on file.   Social History Main Topics  . Smoking status: Never Smoker  . Smokeless tobacco: Never Used  . Alcohol use No  . Drug use: No  . Sexual activity: Yes    Birth control/ protection: Condom   Other Topics Concern  . Not on file   Social History Narrative  . No narrative on file    Family History  Problem Relation Age of Onset  . Heart failure Maternal Grandmother   . Heart disease Maternal Grandmother   . Breast cancer Neg Hx   . Ovarian cancer Neg Hx   . Colon cancer Neg Hx     The following portions of the patient's history were reviewed and updated as appropriate: allergies, current medications, past family history, past medical history, past social history, past surgical history and problem list.  Review of Systems Review of Systems -Per history of present illness  Objective:   BP 134/87   Pulse 90   Ht 5' 3"  (1.6 m)  Wt 156 lb 1.6 oz (70.8 kg)   LMP 03/04/2016 (Exact Date)   BMI 27.65 kg/m  CONSTITUTIONAL: Well-developed, well-nourished female in no acute distress.  HENT:  Normocephalic, atraumatic.  NECK: Normal range of motion, supple, no masses.  Normal thyroid.  SKIN: Skin is warm and dry. No rash noted. Not diaphoretic. No erythema. No pallor. [place]: Alert and oriented to person, place, and time. PSYCHIATRIC: Normal mood and affect. Normal behavior. Normal judgment and thought content. CARDIOVASCULAR:Regular rate and rhythm without murmur RESPIRATORY: Lungs clear to auscultation BREASTS: Bilaterally symmetric without dominant mass, skin change, or nipple discharge; diffuse fibrocystic changes are noted; right axilla notable for 1 cm lymph node, 1/4 tender ABDOMEN: Not examined PELVIC: Not examined  Assessment:   1. Bilateral mastodynia - MM Digital Diagnostic Bilat; Future - US BREAST LTD UNI LEFT INC AXILLA; Future - US BREAST LTD UNI RIGHT INC AXILLA; Future  2. Axillary adenopathy - MM Digital  Diagnostic Bilat; Future - US BREAST LTD UNI RIGHT INC AXILLA; Future     Plan:   1. Diagnostic mammogram with possible ultrasound 2. Return in 2 months for annual exam  A total of 15 minutes were spent face-to-face with the patient during this encounter and over half of that time dealt with counseling and coordination of care.  Brayton Mars, MD  Note: This dictation was prepared with Dragon dictation along with smaller phrase technology. Any transcriptional errors that result from this process are unintentional.

## 2016-03-29 NOTE — Patient Instructions (Signed)
1. Diagnostic mammogram and ultrasound are ordered 2. Return in 2 months for annual exam

## 2016-04-01 IMAGING — US US EXTREM LOW VENOUS*R*
1 series · 13 of 24 positions shown · non-contrast
Comparison: None.

CLINICAL DATA: Right leg pain and swelling and tenderness.



[Series 1: us extrem low venous*right* · 0.07mm/px · 13 of 38 slices shown]
[im 1/38]
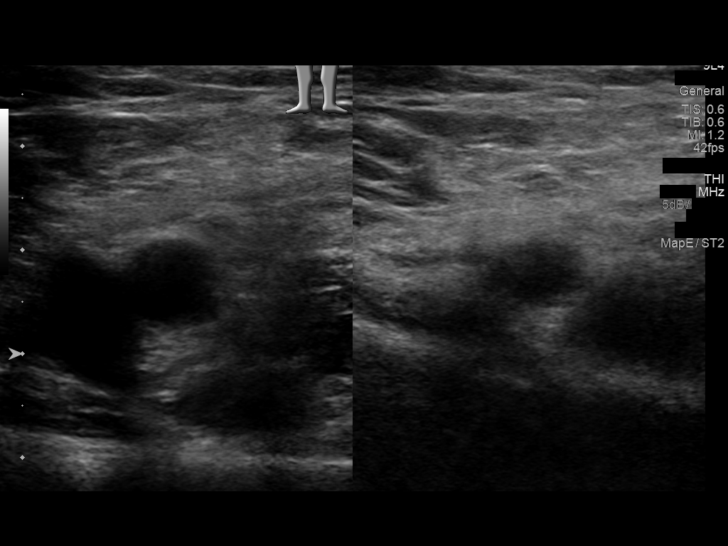
[im 4/38]
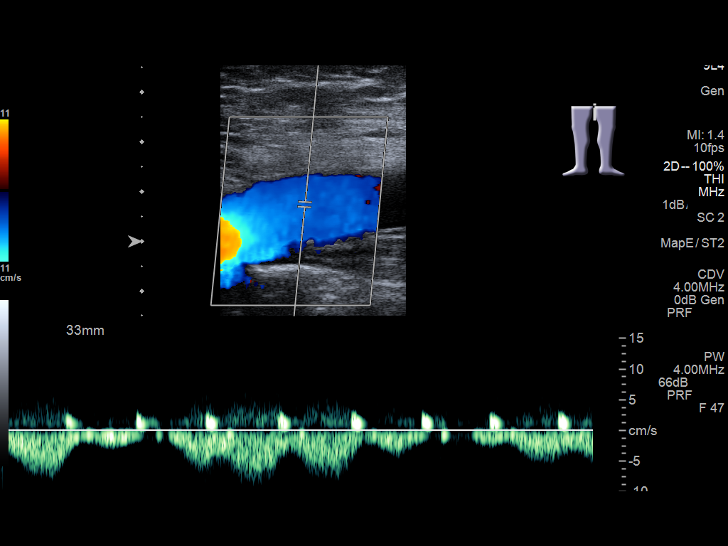
[im 7/38]
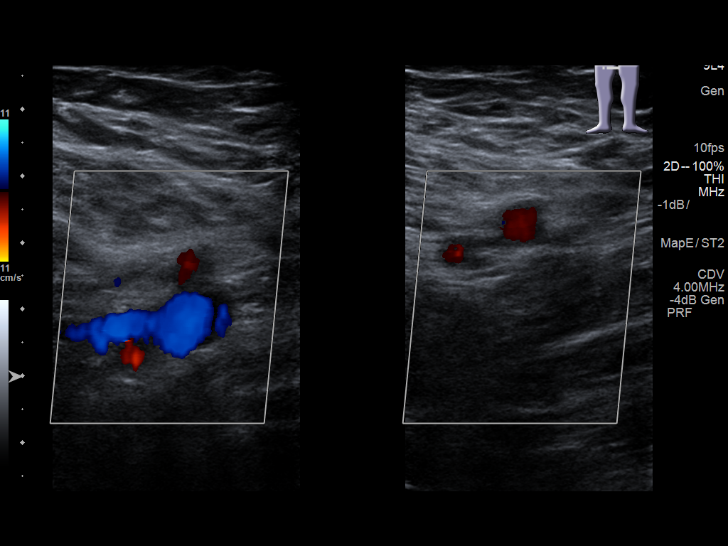
[im 10/38]
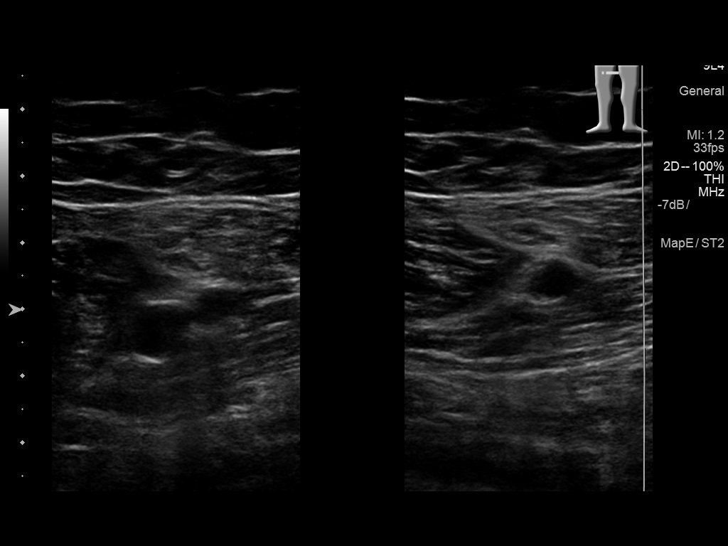
[im 13/38]
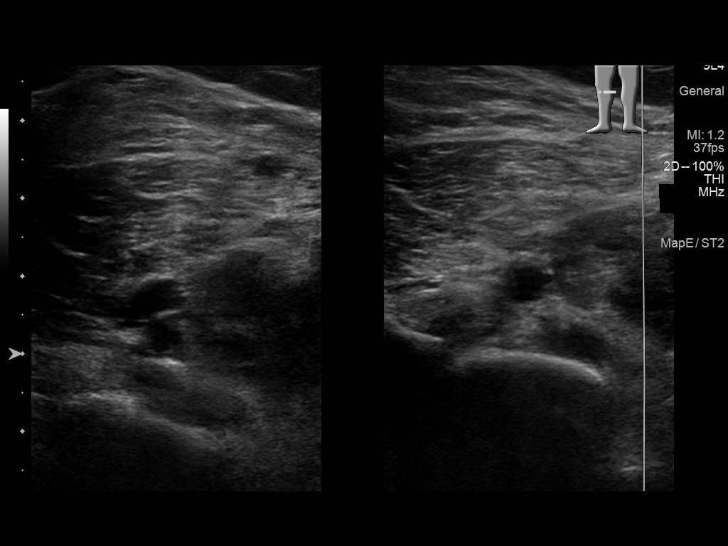
[im 17/38]
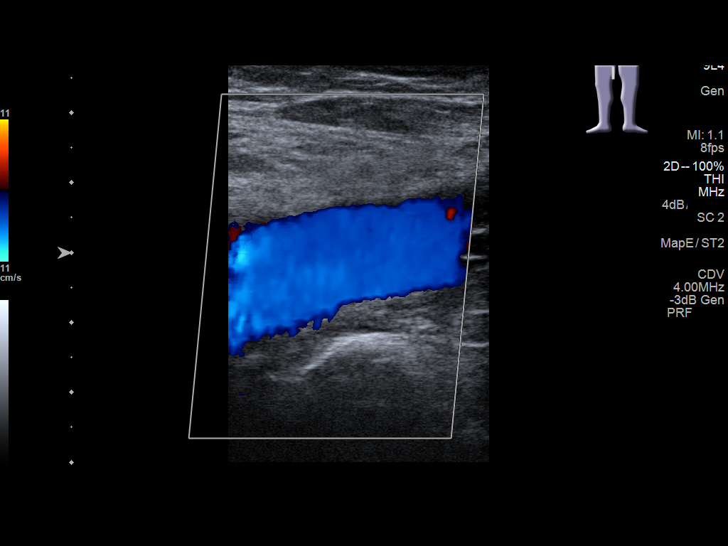
[im 20/38]
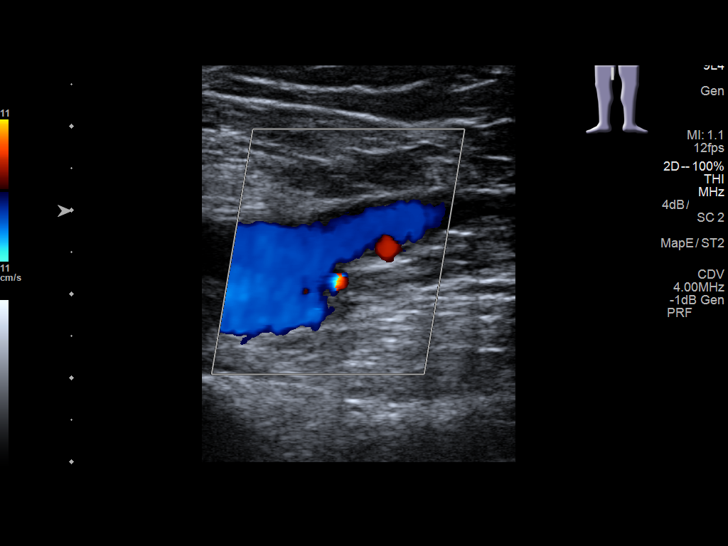
[im 21/38]
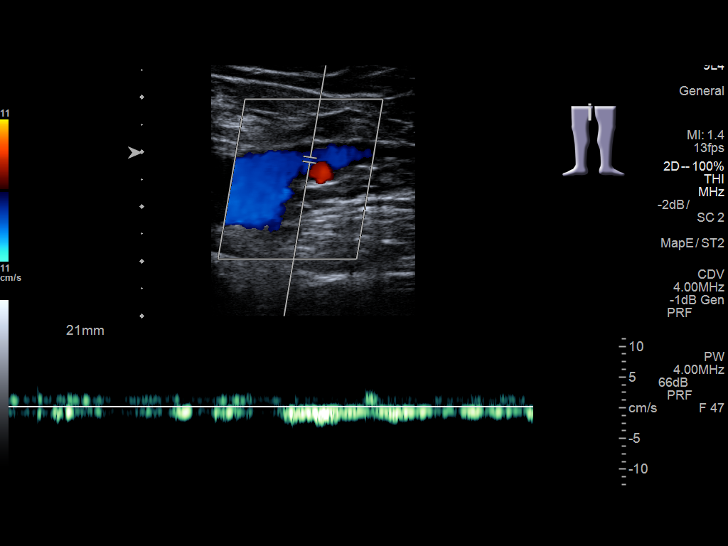
[im 25/38]
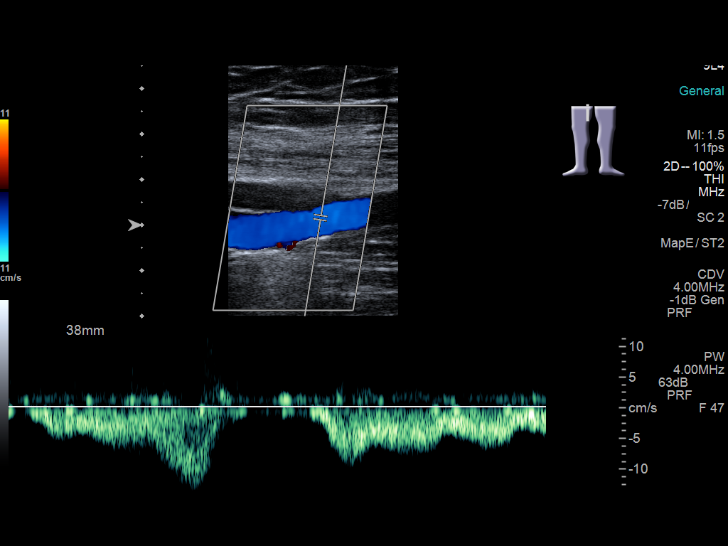
[im 28/38]
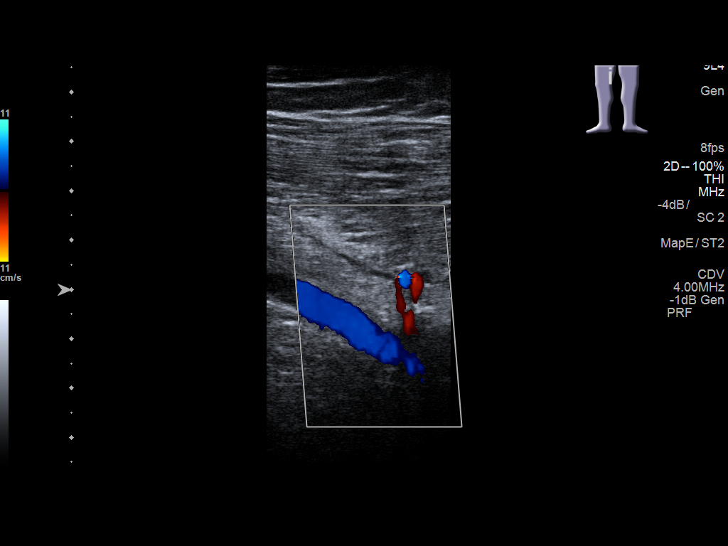
[im 31/38]
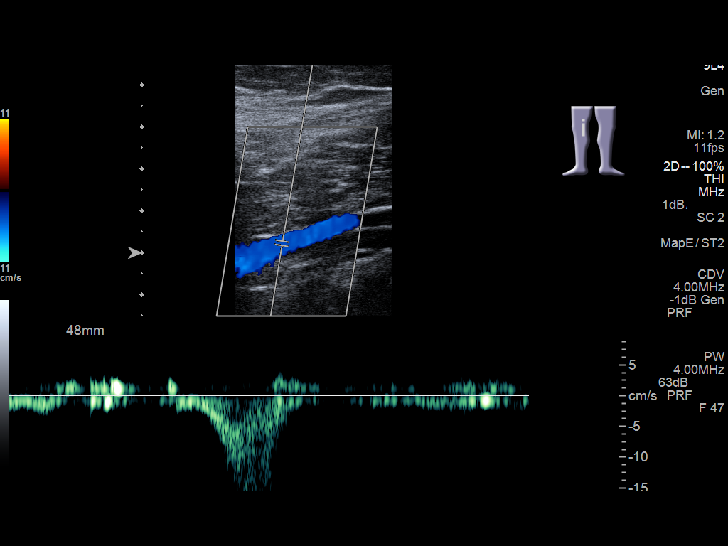
[im 34/38]
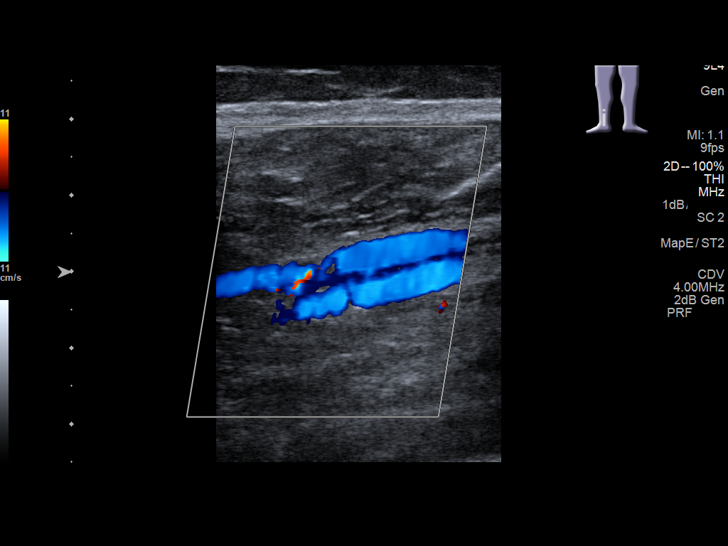
[im 38/38]
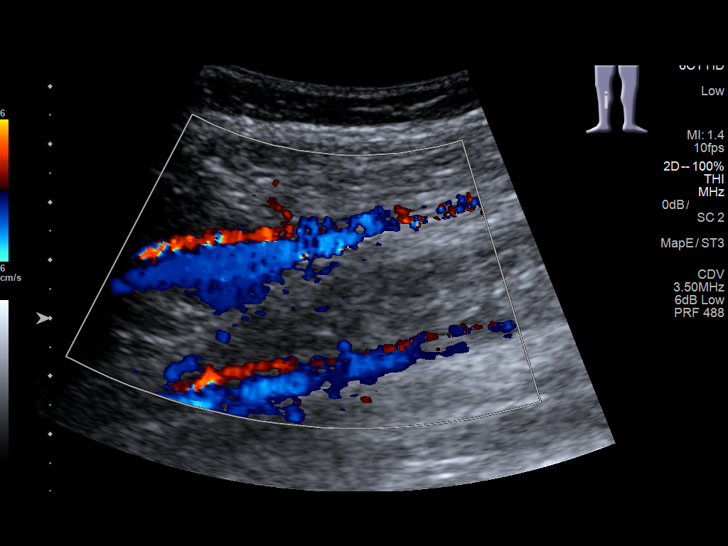

[13 of 24 positions shown; findings below may reference images not displayed]

FINDINGS: Contralateral Common Femoral Vein: Respiratory phasicity is normal
and symmetric with the symptomatic side. No evidence of thrombus.
Normal compressibility.

Common Femoral Vein: No evidence of thrombus. Normal
compressibility, respiratory phasicity and response to augmentation.

Saphenofemoral Junction: No evidence of thrombus. Normal
compressibility and flow on color Doppler imaging.

Profunda Femoral Vein: No evidence of thrombus. Normal
compressibility and flow on color Doppler imaging.

Femoral Vein: No evidence of thrombus. Normal compressibility,
respiratory phasicity and response to augmentation.

Popliteal Vein: No evidence of thrombus. Normal compressibility,
respiratory phasicity and response to augmentation.

Calf Veins: No evidence of thrombus. Normal compressibility and flow
on color Doppler imaging.

Superficial Great Saphenous Vein: No evidence of thrombus. Normal
compressibility and flow on color Doppler imaging.

Venous Reflux:  None.

Other Findings:  None.
IMPRESSION: No evidence of deep venous thrombosis.

## 2016-04-19 ENCOUNTER — Ambulatory Visit
Admission: RE | Admit: 2016-04-19 | Discharge: 2016-04-19 | Disposition: A | Discharge status: Home / Self Care | Payer: 59 | Source: Ambulatory Visit | Attending: Obstetrics and Gynecology | Admitting: Obstetrics and Gynecology

## 2016-04-19 ENCOUNTER — Other Ambulatory Visit: Payer: Self-pay | Admitting: Obstetrics and Gynecology

## 2016-04-19 DIAGNOSIS — R590 Localized enlarged lymph nodes: Secondary | ICD-10-CM | POA: Diagnosis not present

## 2016-04-19 DIAGNOSIS — N644 Mastodynia: Secondary | ICD-10-CM

## 2016-04-19 DIAGNOSIS — N6310 Unspecified lump in the right breast, unspecified quadrant: Secondary | ICD-10-CM | POA: Diagnosis not present

## 2016-05-24 ENCOUNTER — Encounter: Payer: 59 | Admitting: Obstetrics and Gynecology

## 2016-06-01 NOTE — Progress Notes (Signed)
ANNUAL PREVENTATIVE CARE GYN  ENCOUNTER NOTE  Subjective:       Connie Santiago is a 50 y.o. [phone] female here for a routine annual gynecologic exam.  Current complaints: 1.  None  Menstrual cycles slightly irregular with last interval 32 days. No reported vasomotor symptoms. Bowel and bladder function are normal. Patient has arthritic changes in her right knee; this will be evaluated in the near future   Gynecologic History No LMP recorded. Contraception: none Last Pap: unsure of date normal resutls. Results were: normal Last mammogram: 04/19/2016 birad 1 Results were: normal  Obstetric History OB History  Gravida Para Term Preterm AB Living  8 7 7   1 7   SAB TAB Ectopic Multiple Live Births  1       7    # Outcome Date GA Lbr Len/2nd Weight Sex Delivery Anes PTL Lv  8 SAB 2013          7 Term 2009   8 lb (3.629 kg) M Vag-Spont   LIV  6 Term 2002   8 lb 9.6 oz (3.901 kg) M Vag-Spont   LIV  5 Term 1996   6 lb 1.9 oz (2.776 kg) F Vag-Spont   LIV  4 Term 1995   7 lb 11.2 oz (3.493 kg) M Vag-Spont   LIV  3 Term 1992   6 lb (2.722 kg) F Vag-Spont   LIV  2 Term 1990   7 lb (3.175 kg) M Vag-Spont   LIV  1 Term 1989   6 lb (2.722 kg) F Vag-Spont   LIV      Past Medical History:  Diagnosis Date  . Anemia   . Asthma in child   . Cervical prolapse   . Fibroid   . Swollen R knee     Past Surgical History:  Procedure Laterality Date  . NO PAST SURGERIES      No current outpatient prescriptions on file prior to visit.   No current facility-administered medications on file prior to visit.     Allergies  Allergen Reactions  . Erythromycin   . Penicillins     Social History   Social History  . Marital status: Married    Spouse name: N/A  . Number of children: N/A  . Years of education: N/A   Occupational History  . Not on file.   Social History Main Topics  . Smoking status: Never Smoker  . Smokeless tobacco: Never Used  . Alcohol use No  . Drug use:  No  . Sexual activity: Yes    Birth control/ protection: Condom   Other Topics Concern  . Not on file   Social History Narrative  . No narrative on file    Family History  Problem Relation Age of Onset  . Heart failure Maternal Grandmother   . Heart disease Maternal Grandmother   . Breast cancer Neg Hx   . Ovarian cancer Neg Hx   . Colon cancer Neg Hx     The following portions of the patient's history were reviewed and updated as appropriate: allergies, current medications, past family history, past medical history, past social history, past surgical history and problem list.  Review of Systems ROS Review of Systems - General ROS: negative for - chills, fatigue, fever, hot flashes, night sweats, weight gain or weight loss Psychological ROS: negative for - anxiety, decreased libido, depression, mood swings, physical abuse or sexual abuse Ophthalmic ROS: negative for - blurry vision, eye pain  or loss of vision ENT ROS: negative for - headaches, hearing change, visual changes or vocal changes Allergy and Immunology ROS: negative for - hives, itchy/watery eyes or seasonal allergies Hematological and Lymphatic ROS: negative for - bleeding problems, bruising, swollen lymph nodes or weight loss Endocrine ROS: negative for - galactorrhea, hair pattern changes, hot flashes, malaise/lethargy, mood swings, palpitations, polydipsia/polyuria, skin changes, temperature intolerance or unexpected weight changes Breast ROS: negative for - new or changing breast lumps or nipple discharge Respiratory ROS: negative for - cough or shortness of breath Cardiovascular ROS: negative for - chest pain, irregular heartbeat, palpitations or shortness of breath Gastrointestinal ROS: no abdominal pain, change in bowel habits, or black or bloody stools Genito-Urinary ROS: no dysuria, trouble voiding, or hematuria Musculoskeletal ROS: negative for - joint pain or joint stiffness Neurological ROS: negative for -  bowel and bladder control changes Dermatological ROS: negative for rash and skin lesion changes   Objective:   BP (!) 147/83   Pulse 96   Ht 5\' 3"  (1.6 m)   Wt 157 lb 6.4 oz (71.4 kg)   LMP 05/27/2016 (Exact Date)   BMI 27.88 kg/m  CONSTITUTIONAL: Well-developed, well-nourished female in no acute distress.  PSYCHIATRIC: Normal mood and affect. Normal behavior. Normal judgment and thought content. [name]: Alert and oriented to person, place, and time. Normal muscle tone coordination. No cranial nerve deficit noted. HENT:  Normocephalic, atraumatic, External right and left ear normal. Oropharynx is clear and moist EYES: Conjunctivae and EOM are normal. Pupils are equal, round, and reactive to light. No scleral icterus.  NECK: Normal range of motion, supple, no masses.  Normal thyroid.  SKIN: Skin is warm and dry. No rash noted. Not diaphoretic. No erythema. No pallor. CARDIOVASCULAR: Normal heart rate noted, regular rhythm, no murmur. RESPIRATORY: Clear to auscultation bilaterally. Effort and breath sounds normal, no problems with respiration noted. BREASTS: Symmetric in size. No masses, skin changes, nipple drainage, or lymphadenopathy. ABDOMEN: Soft, normal bowel sounds, no distention noted.  No tenderness, rebound or guarding.  BLADDER: Normal PELVIC:  External Genitalia: Normal  BUS: Normal  Vagina: Normal  Cervix: Normal; parous; no cervical motion tenderness; mobile  Uterus: Irregular, 10-12 weeks size, mobile, nontender, left sided prominence  Adnexa: Normal  RV: External Exam NormaI, No Rectal Masses and Normal Sphincter tone  MUSCULOSKELETAL: Normal range of motion. No tenderness.  No cyanosis, clubbing, or edema.  2+ distal pulses. LYMPHATIC: No Axillary, Supraclavicular, or Inguinal Adenopathy.    Assessment:   Annual gynecologic examination 50 y.o. Contraception: none Overweight Fibroid uterus  Plan:  Pap: Pap Co Test Mammogram: UTD Stool Guaiac Testing:   Not Indicated Labs: PCP Routine preventative health maintenance measures emphasized: Exercise/Diet/Weight control, Tobacco Warnings and Alcohol/Substance use risks Return to [place], CMA  Brayton Mars, MD  Note: This dictation was prepared with Dragon dictation along with smaller phrase technology. Any transcriptional errors that result from this process are unintentional.

## 2016-06-07 ENCOUNTER — Ambulatory Visit (INDEPENDENT_AMBULATORY_CARE_PROVIDER_SITE_OTHER): Payer: 59 | Admitting: Obstetrics and Gynecology

## 2016-06-07 ENCOUNTER — Encounter: Payer: Self-pay | Admitting: Obstetrics and Gynecology

## 2016-06-07 VITALS — BP 147/83 | HR 96 | Ht 63.0 in | Wt 157.4 lb

## 2016-06-07 DIAGNOSIS — Z1239 Encounter for other screening for malignant neoplasm of breast: Secondary | ICD-10-CM

## 2016-06-07 DIAGNOSIS — D259 Leiomyoma of uterus, unspecified: Secondary | ICD-10-CM

## 2016-06-07 DIAGNOSIS — N951 Menopausal and female climacteric states: Secondary | ICD-10-CM | POA: Diagnosis not present

## 2016-06-07 DIAGNOSIS — Z1231 Encounter for screening mammogram for malignant neoplasm of breast: Secondary | ICD-10-CM | POA: Diagnosis not present

## 2016-06-07 DIAGNOSIS — Z01419 Encounter for gynecological examination (general) (routine) without abnormal findings: Secondary | ICD-10-CM | POA: Diagnosis not present

## 2016-06-07 NOTE — Patient Instructions (Signed)
1. Pap smear done 2. Mammogram already completed and normal 3. Continue with healthy eating and exercise 4. Screening labs through primary care 5. Return in 1 year for annual exam  Health Maintenance, Female Introduction Adopting a healthy lifestyle and getting preventive care can go a long way to promote health and wellness. Talk with your health care provider about what schedule of regular examinations is right for you. This is a good chance for you to check in with your provider about disease prevention and staying healthy. In between checkups, there are plenty of things you can do on your own. Experts have done a lot of research about which lifestyle changes and preventive measures are most likely to keep you healthy. Ask your health care provider for more information. Weight and diet Eat a healthy diet  Be sure to include plenty of vegetables, fruits, low-fat dairy products, and lean protein.  Do not eat a lot of foods high in solid fats, added sugars, or salt.  Get regular exercise. This is one of the most important things you can do for your health.  Most adults should exercise for at least 150 minutes each week. The exercise should increase your heart rate and make you sweat (moderate-intensity exercise).  Most adults should also do strengthening exercises at least twice a week. This is in addition to the moderate-intensity exercise. Maintain a healthy weight  Body mass index (BMI) is a measurement that can be used to identify possible weight problems. It estimates body fat based on height and weight. Your health care provider can help determine your BMI and help you achieve or maintain a healthy weight.  For females 44 years of age and older:  A BMI below 18.5 is considered underweight.  A BMI of 18.5 to 24.9 is normal.  A BMI of 25 to 29.9 is considered overweight.  A BMI of 30 and above is considered obese. Watch levels of cholesterol and blood lipids  You should start  having your blood tested for lipids and cholesterol at 50 years of age, then have this test every 5 years.  You may need to have your cholesterol levels checked more often if:  Your lipid or cholesterol levels are high.  You are older than 50 years of age.  You are at high risk for heart disease. Cancer screening Lung Cancer  Lung cancer screening is recommended for adults 15-48 years old who are at high risk for lung cancer because of a history of smoking.  A yearly low-dose CT scan of the lungs is recommended for people who:  Currently smoke.  Have quit within the past 15 years.  Have at least a 30-pack-year history of smoking. A pack year is smoking an average of one pack of cigarettes a day for 1 year.  Yearly screening should continue until it has been 15 years since you quit.  Yearly screening should stop if you develop a health problem that would prevent you from having lung cancer treatment. Breast Cancer  Practice breast self-awareness. This means understanding how your breasts normally appear and feel.  It also means doing regular breast self-exams. Let your health care provider know about any changes, no matter how small.  If you are in your 20s or 30s, you should have a clinical breast exam (CBE) by a health care provider every 1-3 years as part of a regular health exam.  If you are 21 or older, have a CBE every year. Also consider having a breast X-ray (  mammogram) every year.  If you have a family history of breast cancer, talk to your health care provider about genetic screening.  If you are at high risk for breast cancer, talk to your health care provider about having an MRI and a mammogram every year.  Breast cancer gene (BRCA) assessment is recommended for women who have family members with BRCA-related cancers. BRCA-related cancers include:  Breast.  Ovarian.  Tubal.  Peritoneal cancers.  Results of the assessment will determine the need for genetic  counseling and BRCA1 and BRCA2 testing. Cervical Cancer  Your health care provider may recommend that you be screened regularly for cancer of the pelvic organs (ovaries, uterus, and vagina). This screening involves a pelvic examination, including checking for microscopic changes to the surface of your cervix (Pap test). You may be encouraged to have this screening done every 3 years, beginning at age 69.  For women ages 68-65, health care providers may recommend pelvic exams and Pap testing every 3 years, or they may recommend the Pap and pelvic exam, combined with testing for human papilloma virus (HPV), every 5 years. Some types of HPV increase your risk of cervical cancer. Testing for HPV may also be done on women of any age with unclear Pap test results.  Other health care providers may not recommend any screening for nonpregnant women who are considered low risk for pelvic cancer and who do not have symptoms. Ask your health care provider if a screening pelvic exam is right for you.  If you have had past treatment for cervical cancer or a condition that could lead to cancer, you need Pap tests and screening for cancer for at least 20 years after your treatment. If Pap tests have been discontinued, your risk factors (such as having a new sexual partner) need to be reassessed to determine if screening should resume. Some women have medical problems that increase the chance of getting cervical cancer. In these cases, your health care provider may recommend more frequent screening and Pap tests. Colorectal Cancer  This type of cancer can be detected and often prevented.  Routine colorectal cancer screening usually begins at 50 years of age and continues through 50 years of age.  Your health care provider may recommend screening at an earlier age if you have risk factors for colon cancer.  Your health care provider may also recommend using home test kits to check for hidden blood in the stool.  A  small camera at the end of a tube can be used to examine your colon directly (sigmoidoscopy or colonoscopy). This is done to check for the earliest forms of colorectal cancer.  Routine screening usually begins at age 8.  Direct examination of the colon should be repeated every 5-10 years through 50 years of age. However, you may need to be screened more often if early forms of precancerous polyps or small growths are found. Skin Cancer  Check your skin from head to toe regularly.  Tell your health care provider about any new moles or changes in moles, especially if there is a change in a mole's shape or color.  Also tell your health care provider if you have a mole that is larger than the size of a pencil eraser.  Always use sunscreen. Apply sunscreen liberally and repeatedly throughout the day.  Protect yourself by wearing long sleeves, pants, a wide-brimmed hat, and sunglasses whenever you are outside. Heart disease, diabetes, and high blood pressure  High blood pressure causes heart disease  and increases the risk of stroke. High blood pressure is more likely to develop in:  People who have blood pressure in the high end of the normal range (130-139/85-89 mm Hg).  People who are overweight or obese.  People who are African American.  If you are 12-70 years of age, have your blood pressure checked every 3-5 years. If you are 19 years of age or older, have your blood pressure checked every year. You should have your blood pressure measured twice-once when you are at a hospital or clinic, and once when you are not at a hospital or clinic. Record the average of the two measurements. To check your blood pressure when you are not at a hospital or clinic, you can use:  An automated blood pressure machine at a pharmacy.  A home blood pressure monitor.  If you are between 34 years and 93 years old, ask your health care provider if you should take aspirin to prevent strokes.  Have regular  diabetes screenings. This involves taking a blood sample to check your fasting blood sugar level.  If you are at a normal weight and have a low risk for diabetes, have this test once every three years after 50 years of age.  If you are overweight and have a high risk for diabetes, consider being tested at a younger age or more often. Preventing infection Hepatitis B  If you have a higher risk for hepatitis B, you should be screened for this virus. You are considered at high risk for hepatitis B if:  You were born in a country where hepatitis B is common. Ask your health care provider which countries are considered high risk.  Your parents were born in a high-risk country, and you have not been immunized against hepatitis B (hepatitis B vaccine).  You have HIV or AIDS.  You use needles to inject street drugs.  You live with someone who has hepatitis B.  You have had sex with someone who has hepatitis B.  You get hemodialysis treatment.  You take certain medicines for conditions, including cancer, organ transplantation, and autoimmune conditions. Hepatitis C  Blood testing is recommended for:  Everyone born from [birth year] through 1964.  Anyone with known risk factors for hepatitis C. Sexually transmitted infections (STIs)  You should be screened for sexually transmitted infections (STIs) including gonorrhea and chlamydia if:  You are sexually active and are younger than 50 years of age.  You are older than 49 years of age and your health care provider tells you that you are at risk for this type of infection.  Your sexual activity has changed since you were last screened and you are at an increased risk for chlamydia or gonorrhea. Ask your health care provider if you are at risk.  If you do not have HIV, but are at risk, it may be recommended that you take a prescription medicine daily to prevent HIV infection. This is called pre-exposure prophylaxis (PrEP). You are considered at  risk if:  You are sexually active and do not regularly use condoms or know the HIV status of your partner(s).  You take drugs by injection.  You are sexually active with a partner who has HIV. Talk with your health care provider about whether you are at high risk of being infected with HIV. If you choose to begin PrEP, you should first be tested for HIV. You should then be tested every 3 months for as long as you are taking PrEP. Pregnancy  If you are premenopausal and you may become pregnant, ask your health care provider about preconception counseling.  If you may become pregnant, take 400 to 800 micrograms (mcg) of folic acid every day.  If you want to prevent pregnancy, talk to your health care provider about birth control (contraception). Osteoporosis and menopause  Osteoporosis is a disease in which the bones lose minerals and strength with aging. This can result in serious bone fractures. Your risk for osteoporosis can be identified using a bone density scan.  If you are 23 years of age or older, or if you are at risk for osteoporosis and fractures, ask your health care provider if you should be screened.  Ask your health care provider whether you should take a calcium or vitamin D supplement to lower your risk for osteoporosis.  Menopause may have certain physical symptoms and risks.  Hormone replacement therapy may reduce some of these symptoms and risks. Talk to your health care provider about whether hormone replacement therapy is right for you. Follow these instructions at home:  Schedule regular health, dental, and eye exams.  Stay current with your immunizations.  Do not use any tobacco products including cigarettes, chewing tobacco, or electronic cigarettes.  If you are pregnant, do not drink alcohol.  If you are breastfeeding, limit how much and how often you drink alcohol.  Limit alcohol intake to no more than 1 drink per day for nonpregnant women. One drink  equals 12 ounces of beer, 5 ounces of wine, or 1 ounces of hard liquor.  Do not use street drugs.  Do not share needles.  Ask your health care provider for help if you need support or information about quitting drugs.  Tell your health care provider if you often feel depressed.  Tell your health care provider if you have ever been abused or do not feel safe at home. This information is not intended to replace advice given to you by your health care provider. Make sure you discuss any questions you have with your health care provider. Document Released: 12/05/2010 Document Revised: 10/28/2015 Document Reviewed: 02/23/2015  2017 [employer]

## 2016-06-09 LAB — PAP IG AND HPV HIGH-RISK
HPV, high-risk: NEGATIVE
PAP Smear Comment: 0

## 2016-07-26 DIAGNOSIS — Z0000 Encounter for general adult medical examination without abnormal findings: Secondary | ICD-10-CM | POA: Diagnosis not present

## 2016-07-26 DIAGNOSIS — D500 Iron deficiency anemia secondary to blood loss (chronic): Secondary | ICD-10-CM | POA: Diagnosis not present

## 2016-07-26 DIAGNOSIS — E785 Hyperlipidemia, unspecified: Secondary | ICD-10-CM | POA: Diagnosis not present

## 2016-12-17 IMAGING — US US BREAST*R* LIMITED INC AXILLA
1 series · 2 of 2 positions shown · non-contrast
Comparison: None

CLINICAL DATA: Palpable lump within the right axilla/axillary tail,
recently decreased in size per the patient.Bilateral breast
tenderness.

This is a baseline mammogram.
EXAM:
2D DIGITAL DIAGNOSTIC BILATERAL MAMMOGRAM WITH CAD AND ADJUNCT TOMO
ULTRASOUND RIGHT BREAST

[Series 1: us breast*right* limited inc axilla · 0.07mm/px · 2 of 2 slices shown]
[im 1/2]
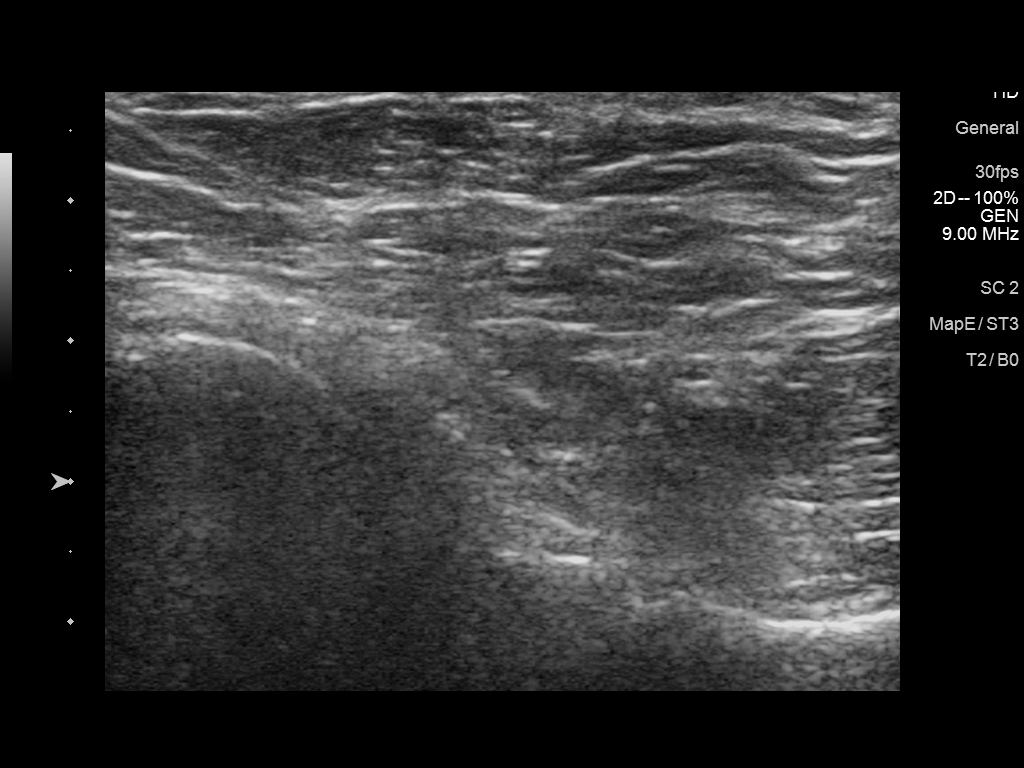
[im 2/2]
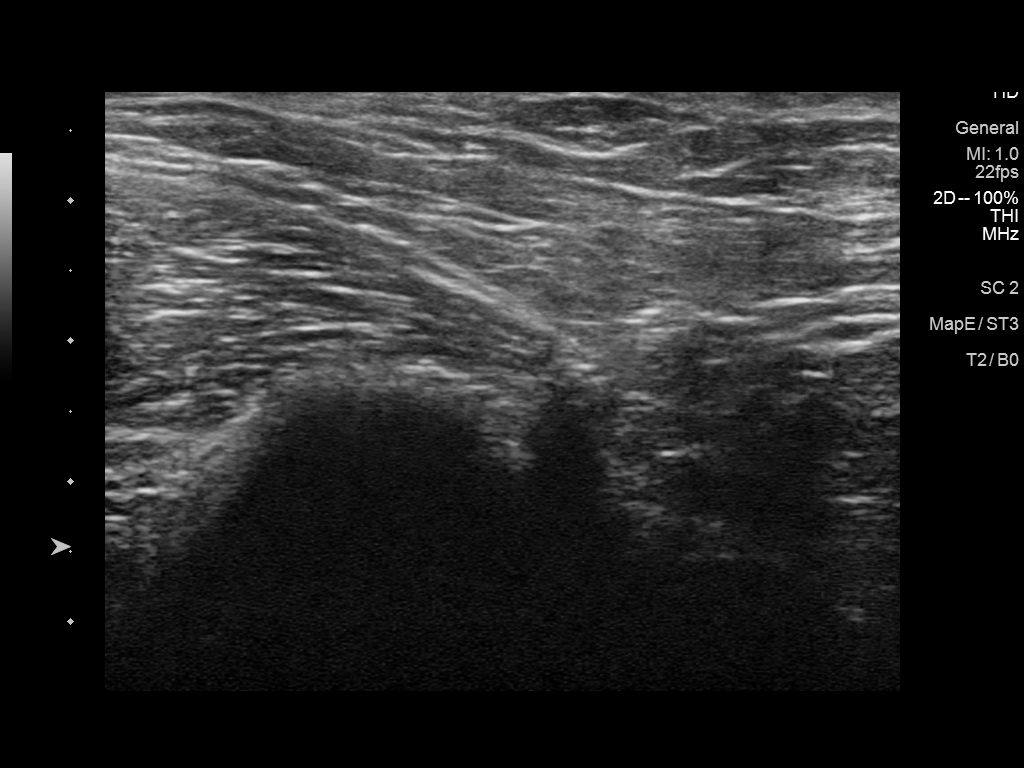

[2 of 2 positions shown; findings below may reference images not displayed]

ACR Breast Density Category b: There are scattered areas of
fibroglandular density.
FINDINGS: Bilateral 2D CC and MLO projections were obtained, with additional
3D tomosynthesis, and with additional exaggerated view of the right
axillary tail/axilla.

There are no dominant masses, suspicious calcifications or secondary
signs of malignancy within either breast. No mass or enlarged lymph
nodes identified within the right axillary tail or within the
visualized portion of the right axilla.

Mammographic images were processed with CAD.

On physical exam, I do not feel had a mass in the right axilla.
Patient states that she also no longer feels the mass.

Targeted ultrasound is performed, evaluating the right axilla
corresponding to the area of clinical concern, showing no enlarged
or morphologically abnormal lymph nodes. There are no suspicious
solid or cystic masses. No evidence of soft tissue edema.
IMPRESSION: No evidence of malignancy within either breast. No evidence of
malignancy within the right axilla.

RECOMMENDATION:
Screening mammogram in one year.(Code:[id number])

The patient was instructed to return sooner if the palpable lump in
the right axilla returns.

I have discussed the findings and recommendations with the patient.
Results were also provided in writing at the conclusion of the
visit. If applicable, a reminder letter will be sent to the patient
regarding the next appointment.

BI-RADS CATEGORY  1: Negative.

## 2017-11-06 DIAGNOSIS — M25551 Pain in right hip: Secondary | ICD-10-CM | POA: Diagnosis not present

## 2017-11-06 DIAGNOSIS — E663 Overweight: Secondary | ICD-10-CM | POA: Diagnosis not present

## 2017-11-06 DIAGNOSIS — G603 Idiopathic progressive neuropathy: Secondary | ICD-10-CM | POA: Diagnosis not present

## 2017-11-06 DIAGNOSIS — R5383 Other fatigue: Secondary | ICD-10-CM | POA: Diagnosis not present

## 2017-11-06 DIAGNOSIS — J309 Allergic rhinitis, unspecified: Secondary | ICD-10-CM | POA: Diagnosis not present

## 2017-11-06 DIAGNOSIS — E559 Vitamin D deficiency, unspecified: Secondary | ICD-10-CM | POA: Diagnosis not present

## 2017-11-06 DIAGNOSIS — M25561 Pain in right knee: Secondary | ICD-10-CM | POA: Diagnosis not present

## 2017-11-19 ENCOUNTER — Inpatient Hospital Stay: Payer: 59

## 2017-11-19 ENCOUNTER — Inpatient Hospital Stay: Payer: 59 | Attending: Oncology | Admitting: Oncology

## 2017-11-19 ENCOUNTER — Other Ambulatory Visit: Payer: Self-pay

## 2017-11-19 ENCOUNTER — Encounter: Payer: Self-pay | Admitting: Oncology

## 2017-11-19 VITALS — BP 131/83 | HR 91 | Temp 98.8°F | Resp 18 | Ht 63.0 in | Wt 152.8 lb

## 2017-11-19 DIAGNOSIS — Z79899 Other long term (current) drug therapy: Secondary | ICD-10-CM | POA: Insufficient documentation

## 2017-11-19 DIAGNOSIS — N924 Excessive bleeding in the premenopausal period: Secondary | ICD-10-CM | POA: Diagnosis not present

## 2017-11-19 DIAGNOSIS — D509 Iron deficiency anemia, unspecified: Secondary | ICD-10-CM | POA: Insufficient documentation

## 2017-11-19 DIAGNOSIS — D649 Anemia, unspecified: Secondary | ICD-10-CM

## 2017-11-19 LAB — CBC WITH DIFFERENTIAL/PLATELET
BLASTS: 0 %
Band Neutrophils: 0 %
Basophils Absolute: 0 10*3/uL (ref 0–0.1)
Basophils Relative: 1 %
Eosinophils Absolute: 0.1 10*3/uL (ref 0–0.7)
Eosinophils Relative: 2 %
HEMATOCRIT: 21.5 % — AB (ref 35.0–47.0)
HEMOGLOBIN: 6.2 g/dL — AB (ref 12.0–16.0)
LYMPHS PCT: 28 %
Lymphs Abs: 0.8 10*3/uL — ABNORMAL LOW (ref 1.0–3.6)
MCH: 17.7 pg — ABNORMAL LOW (ref 26.0–34.0)
MCHC: 28.8 g/dL — AB (ref 32.0–36.0)
MCV: 61.7 fL — AB (ref 80.0–100.0)
MYELOCYTES: 0 %
Metamyelocytes Relative: 0 %
Monocytes Absolute: 0.2 10*3/uL (ref 0.2–0.9)
Monocytes Relative: 8 %
NEUTROS PCT: 61 %
NRBC: 0 /100{WBCs}
Neutro Abs: 1.9 10*3/uL (ref 1.4–6.5)
OTHER: 0 %
PROMYELOCYTES RELATIVE: 0 %
Platelets: 304 10*3/uL (ref 150–440)
RBC: 3.48 MIL/uL — ABNORMAL LOW (ref 3.80–5.20)
RDW: 19.7 % — AB (ref 11.5–14.5)
WBC: 3 10*3/uL — ABNORMAL LOW (ref 3.6–11.0)

## 2017-11-19 LAB — IRON AND TIBC
Iron: 12 ug/dL — ABNORMAL LOW (ref 28–170)
SATURATION RATIOS: 3 % — AB (ref 10.4–31.8)
TIBC: 439 ug/dL (ref 250–450)
UIBC: 427 ug/dL

## 2017-11-19 LAB — FERRITIN: Ferritin: 2 ng/mL — ABNORMAL LOW (ref 11–307)

## 2017-11-19 LAB — TECHNOLOGIST SMEAR REVIEW: Tech Review: ADEQUATE

## 2017-11-19 LAB — RETICULOCYTES
RBC.: 3.58 MIL/uL — ABNORMAL LOW (ref 3.80–5.20)
Retic Count, Absolute: 60.9 10*3/uL (ref 19.0–183.0)
Retic Ct Pct: 1.7 % (ref 0.4–3.1)

## 2017-11-19 NOTE — Progress Notes (Signed)
Hematology/Oncology Consult note [facility] [place] Telephone:([phone] [phone]   Patient Care Team: Patient, No Pcp Per as PCP - General (General Practice)  REFERRING PROVIDER: Charlynn Grimes NP CHIEF COMPLAINTS/REASON FOR VISIT:  Evaluation of anemia.  HISTORY OF PRESENTING ILLNESS:  Connie Santiago is a  52 y.o.  female with PMH listed below who was referred to me for evaluation of anemia. Reviewed patient at [place]. Patient recently had lab work done which revealed anemia with hemoglobin of 6.5, MCV 66, platelet counts 429,000, WBC 3.2, normal creatinine 0.71.  Bilirubin 0.3.  B12 795  Patient reports chronic history of iron deficiency for many years.  She has taken iron pills in the past but due to the GI toxicities she can cannot continue.  Reports heavy menstrual.  And history of fibroid uterus disease..  No aggravating or improving factors.  Associated symptoms: Patient reports fatigue.  SOB with exertion.  Context: Denies weight loss, easy bruising, hematochezia, hemoptysis, hematuria.   Context: History of GI bleeding: Denies               History of CKD denies              She works as Warden/ranger at [employer].  Review of Systems  Constitutional: Positive for malaise/fatigue. Negative for chills, fever and weight loss.  HENT: Negative for congestion, ear discharge, ear pain, nosebleeds, sinus pain and sore throat.   Eyes: Negative for double vision, photophobia, pain, discharge and redness.  Respiratory: Positive for shortness of breath. Negative for cough, hemoptysis, sputum production and wheezing.   Cardiovascular: Negative for chest pain, palpitations, orthopnea, claudication and leg swelling.  Gastrointestinal: Negative for abdominal pain, blood in stool, constipation, diarrhea, heartburn, melena, nausea and vomiting.  Genitourinary: Negative for dysuria, flank pain, frequency and hematuria.  Musculoskeletal:  Negative for back pain, myalgias and neck pain.  Skin: Negative for itching and rash.  Neurological: Negative for dizziness, tingling, tremors, focal weakness, weakness and headaches.  Endo/Heme/Allergies: Negative for environmental allergies. Does not bruise/bleed easily.  Psychiatric/Behavioral: Negative for depression and hallucinations. The patient is not nervous/anxious.     MEDICAL HISTORY:  Past Medical History:  Diagnosis Date  . Anemia   . Asthma in child   . Cervical prolapse   . Fibroid   . Swollen R knee     SURGICAL HISTORY: Past Surgical History:  Procedure Laterality Date  . NO PAST SURGERIES      SOCIAL HISTORY: Social History   Socioeconomic History  . Marital status: Married    Spouse name: Not on file  . Number of children: Not on file  . Years of education: Not on file  . Highest education level: Not on file  Occupational History  . Not on file  Social Needs  . Financial resource strain: Not on file  . Food insecurity:    Worry: Not on file    Inability: Not on file  . Transportation needs:    Medical: Not on file    Non-medical: Not on file  Tobacco Use  . Smoking status: Never Smoker  . Smokeless tobacco: Never Used  Substance and Sexual Activity  . Alcohol use: No    Alcohol/week: 0.0 oz  . Drug use: No  . Sexual activity: Yes    Birth control/protection: Condom  Lifestyle  . Physical activity:    Days per week: Not on file    Minutes per session: Not on file  . Stress:  Not on file  Relationships  . Social connections:    Talks on phone: Not on file    Gets together: Not on file    Attends religious service: Not on file    Active member of club or organization: Not on file    Attends meetings of clubs or organizations: Not on file    Relationship status: Not on file  . Intimate partner violence:    Fear of current or ex partner: Not on file    Emotionally abused: Not on file    Physically abused: Not on file    Forced sexual  activity: Not on file  Other Topics Concern  . Not on file  Social History Narrative  . Not on file    FAMILY HISTORY: Family History  Problem Relation Age of Onset  . Heart failure Maternal Grandmother   . Heart disease Maternal Grandmother   . Breast cancer Neg Hx   . Ovarian cancer Neg Hx   . Colon cancer Neg Hx     ALLERGIES:  is allergic to erythromycin and penicillins.  MEDICATIONS:  Current Outpatient Medications  Medication Sig Dispense Refill  . diclofenac sodium (VOLTAREN) 1 % GEL   2  . PROAIR HFA 108 (90 Base) MCG/ACT inhaler INHALE 1 PUFF BY MOUTH EVERY 6 HOURS IF NEEDED FOR SHORTNESS OF BREATH OR WHEEZING  1   No current facility-administered medications for this visit.      PHYSICAL EXAMINATION: ECOG PERFORMANCE STATUS: 1 - Symptomatic but completely ambulatory Vitals:   11/19/17 0944 11/19/17 0950  BP: 131/83   Pulse: 91   Resp: 18   Temp: 98.8 F (37.1 C)   SpO2:  100%   Filed Weights   11/19/17 0944  Weight: 152 lb 12.8 oz (69.3 kg)    Physical Exam  Constitutional: She is oriented to person, place, and time. She appears well-developed and well-nourished. No distress.  HENT:  Head: Normocephalic and atraumatic.  Right Ear: External ear normal.  Left Ear: External ear normal.  Mouth/Throat: Oropharynx is clear and moist.  Eyes: Pupils are equal, round, and reactive to light. EOM are normal. No scleral icterus.  Pale conjunctivae  Neck: Normal range of motion. Neck supple.  Cardiovascular: Normal rate, regular rhythm and normal heart sounds.  Pulmonary/Chest: Effort normal and breath sounds normal. No respiratory distress. She has no wheezes. She has no rales. She exhibits no tenderness.  Abdominal: Soft. Bowel sounds are normal. She exhibits no distension and no mass. There is no tenderness.  Musculoskeletal: Normal range of motion. She exhibits no edema or deformity.  Lymphadenopathy:    She has no cervical adenopathy.  Neurological: She  is alert and oriented to person, place, and time. No cranial nerve deficit. Coordination normal.  Skin: Skin is warm and dry. No rash noted.  Psychiatric: She has a normal mood and affect. Her behavior is normal. Thought content normal.     LABORATORY DATA:  I have reviewed the data as listed Lab Results  Component Value Date   WBC 3.0 (L) 11/19/2017   HGB 6.2 (L) 11/19/2017   HCT 21.5 (L) 11/19/2017   MCV 61.7 (L) 11/19/2017   PLT 304 11/19/2017   No results for input(s): NA, K, CL, CO2, GLUCOSE, BUN, CREATININE, CALCIUM, GFRNONAA, GFRAA, PROT, ALBUMIN, AST, ALT, ALKPHOS, BILITOT, BILIDIR, IBILI in the last 8760 hours. Iron/TIBC/Ferritin/ %Sat    Component Value Date/Time   IRON 12 (L) 11/19/2017 1027   TIBC 439 11/19/2017 1027  FERRITIN 2 (L) 11/19/2017 1027   IRONPCTSAT 3 (L) 11/19/2017 1027        ASSESSMENT & PLAN:  1. Iron deficiency anemia, unspecified iron deficiency anemia type   2. Excessive bleeding in premenopausal period    Discussed with patient that anemia is most likely due to iron deficiency.  Will order CBC, iron TIBC ferritin today. Labs reviewed consistent with iron deficiency.  Plan IV iron with Venofer 200mg  weekly for 4 doses. Allergy reactions/infusion reaction including anaphylactic reaction discussed with patient. Patient voices understanding and willing to proceed. Given her low hemoglobin <7, advised PRBC transfusion patient declined. Repeat CBC, iron TIBC ferritin in 5 weeks for assessment for need of additional benefit. Advised patient to follow-up with GYN for management of menorrhagia.  She voices understanding. Also advised patient to establish care with GI physician and had screening colonoscopy done.   Orders Placed This Encounter  Procedures  . CBC with Differential/Platelet    Standing Status:   Future    Number of Occurrences:   1    Standing Expiration Date:   11/20/2018  . Iron and TIBC    Standing Status:   Future    Number of  Occurrences:   1    Standing Expiration Date:   11/20/2018  . Ferritin    Standing Status:   Future    Number of Occurrences:   1    Standing Expiration Date:   11/20/2018  . Technologist smear review    Standing Status:   Future    Number of Occurrences:   1    Standing Expiration Date:   11/20/2018  . Reticulocytes    Standing Status:   Future    Number of Occurrences:   1    Standing Expiration Date:   11/20/2018  . CBC with Differential/Platelet    Standing Status:   Future    Standing Expiration Date:   11/20/2018  . Iron and TIBC    Standing Status:   Future    Standing Expiration Date:   11/20/2018  . Ferritin    Standing Status:   Future    Standing Expiration Date:   11/20/2018    All questions were answered. The patient knows to call the clinic with any problems questions or concerns.  Return of visit: 5 weeks Thank you for this kind referral and the opportunity to participate in the care of this patient. A copy of today's note is routed to referring provider  Total face to face encounter time for this patient visit was 40 min. >50% of the time was  spent in counseling and coordination of care.    Earlie Server, MD, PhD Hematology Oncology [facility] at [facility] Pager- [phone] 11/19/2017

## 2017-11-19 NOTE — Progress Notes (Signed)
Patient here today as a new patient. No concerns voiced.

## 2017-11-21 ENCOUNTER — Other Ambulatory Visit: Payer: Self-pay | Admitting: Oncology

## 2017-11-21 ENCOUNTER — Encounter: Payer: Self-pay | Admitting: Oncology

## 2017-11-21 DIAGNOSIS — D500 Iron deficiency anemia secondary to blood loss (chronic): Secondary | ICD-10-CM | POA: Insufficient documentation

## 2017-11-21 HISTORY — DX: Iron deficiency anemia secondary to blood loss (chronic): D50.0

## 2017-11-22 ENCOUNTER — Inpatient Hospital Stay: Payer: 59

## 2017-11-22 VITALS — BP 122/79 | HR 88 | Temp 98.7°F | Resp 18

## 2017-11-22 DIAGNOSIS — N924 Excessive bleeding in the premenopausal period: Secondary | ICD-10-CM | POA: Diagnosis not present

## 2017-11-22 DIAGNOSIS — D500 Iron deficiency anemia secondary to blood loss (chronic): Secondary | ICD-10-CM

## 2017-11-22 DIAGNOSIS — Z79899 Other long term (current) drug therapy: Secondary | ICD-10-CM | POA: Diagnosis not present

## 2017-11-22 DIAGNOSIS — D509 Iron deficiency anemia, unspecified: Secondary | ICD-10-CM | POA: Diagnosis not present

## 2017-11-22 MED ORDER — SODIUM CHLORIDE 0.9 % IV SOLN
Freq: Once | INTRAVENOUS | Status: AC
  Administered 2017-11-22: 14:00:00 via INTRAVENOUS
  Filled 2017-11-22: qty 1000

## 2017-11-22 MED ORDER — IRON SUCROSE 20 MG/ML IV SOLN
200.0000 mg | Freq: Once | INTRAVENOUS | Status: AC
  Administered 2017-11-22: 200 mg via INTRAVENOUS
  Filled 2017-11-22: qty 10

## 2017-11-29 ENCOUNTER — Ambulatory Visit: Payer: Self-pay

## 2017-11-30 ENCOUNTER — Inpatient Hospital Stay: Payer: 59

## 2017-11-30 VITALS — BP 115/77 | HR 80 | Temp 96.6°F | Resp 18

## 2017-11-30 DIAGNOSIS — D509 Iron deficiency anemia, unspecified: Secondary | ICD-10-CM | POA: Diagnosis not present

## 2017-11-30 DIAGNOSIS — N924 Excessive bleeding in the premenopausal period: Secondary | ICD-10-CM | POA: Diagnosis not present

## 2017-11-30 DIAGNOSIS — Z79899 Other long term (current) drug therapy: Secondary | ICD-10-CM | POA: Diagnosis not present

## 2017-11-30 DIAGNOSIS — D500 Iron deficiency anemia secondary to blood loss (chronic): Secondary | ICD-10-CM

## 2017-11-30 MED ORDER — SODIUM CHLORIDE 0.9 % IV SOLN
Freq: Once | INTRAVENOUS | Status: AC
  Administered 2017-11-30: 14:00:00 via INTRAVENOUS
  Filled 2017-11-30: qty 1000

## 2017-11-30 MED ORDER — IRON SUCROSE 20 MG/ML IV SOLN
200.0000 mg | Freq: Once | INTRAVENOUS | Status: AC
Start: 2017-11-30 — End: 2017-11-30
  Administered 2017-11-30: 200 mg via INTRAVENOUS
  Filled 2017-11-30: qty 10

## 2017-12-05 ENCOUNTER — Inpatient Hospital Stay: Payer: 59 | Attending: Oncology

## 2017-12-05 VITALS — BP 118/83 | HR 84 | Temp 98.1°F | Resp 20

## 2017-12-05 DIAGNOSIS — M7989 Other specified soft tissue disorders: Secondary | ICD-10-CM | POA: Diagnosis not present

## 2017-12-05 DIAGNOSIS — N812 Incomplete uterovaginal prolapse: Secondary | ICD-10-CM | POA: Diagnosis not present

## 2017-12-05 DIAGNOSIS — D500 Iron deficiency anemia secondary to blood loss (chronic): Secondary | ICD-10-CM | POA: Insufficient documentation

## 2017-12-05 DIAGNOSIS — N920 Excessive and frequent menstruation with regular cycle: Secondary | ICD-10-CM | POA: Insufficient documentation

## 2017-12-05 DIAGNOSIS — Z79899 Other long term (current) drug therapy: Secondary | ICD-10-CM | POA: Insufficient documentation

## 2017-12-05 DIAGNOSIS — D219 Benign neoplasm of connective and other soft tissue, unspecified: Secondary | ICD-10-CM | POA: Insufficient documentation

## 2017-12-05 MED ORDER — IRON SUCROSE 20 MG/ML IV SOLN
200.0000 mg | Freq: Once | INTRAVENOUS | Status: AC
  Administered 2017-12-05: 200 mg via INTRAVENOUS
  Filled 2017-12-05: qty 10

## 2017-12-05 MED ORDER — SODIUM CHLORIDE 0.9 % IV SOLN
Freq: Once | INTRAVENOUS | Status: AC
  Administered 2017-12-05: 14:00:00 via INTRAVENOUS
  Filled 2017-12-05: qty 1000

## 2017-12-13 ENCOUNTER — Inpatient Hospital Stay: Payer: 59

## 2017-12-13 VITALS — BP 128/85 | HR 75 | Temp 96.0°F | Resp 18

## 2017-12-13 DIAGNOSIS — D500 Iron deficiency anemia secondary to blood loss (chronic): Secondary | ICD-10-CM | POA: Diagnosis not present

## 2017-12-13 DIAGNOSIS — N812 Incomplete uterovaginal prolapse: Secondary | ICD-10-CM | POA: Diagnosis not present

## 2017-12-13 DIAGNOSIS — N920 Excessive and frequent menstruation with regular cycle: Secondary | ICD-10-CM | POA: Diagnosis not present

## 2017-12-13 DIAGNOSIS — M7989 Other specified soft tissue disorders: Secondary | ICD-10-CM | POA: Diagnosis not present

## 2017-12-13 DIAGNOSIS — D219 Benign neoplasm of connective and other soft tissue, unspecified: Secondary | ICD-10-CM | POA: Diagnosis not present

## 2017-12-13 DIAGNOSIS — Z79899 Other long term (current) drug therapy: Secondary | ICD-10-CM | POA: Diagnosis not present

## 2017-12-13 MED ORDER — SODIUM CHLORIDE 0.9 % IV SOLN
Freq: Once | INTRAVENOUS | Status: AC
  Administered 2017-12-13: 14:00:00 via INTRAVENOUS
  Filled 2017-12-13: qty 1000

## 2017-12-13 MED ORDER — IRON SUCROSE 20 MG/ML IV SOLN
200.0000 mg | Freq: Once | INTRAVENOUS | Status: AC
  Administered 2017-12-13: 200 mg via INTRAVENOUS
  Filled 2017-12-13: qty 10

## 2017-12-21 ENCOUNTER — Inpatient Hospital Stay: Payer: 59

## 2017-12-21 DIAGNOSIS — M7989 Other specified soft tissue disorders: Secondary | ICD-10-CM | POA: Diagnosis not present

## 2017-12-21 DIAGNOSIS — N812 Incomplete uterovaginal prolapse: Secondary | ICD-10-CM | POA: Diagnosis not present

## 2017-12-21 DIAGNOSIS — D219 Benign neoplasm of connective and other soft tissue, unspecified: Secondary | ICD-10-CM | POA: Diagnosis not present

## 2017-12-21 DIAGNOSIS — N920 Excessive and frequent menstruation with regular cycle: Secondary | ICD-10-CM | POA: Diagnosis not present

## 2017-12-21 DIAGNOSIS — D500 Iron deficiency anemia secondary to blood loss (chronic): Secondary | ICD-10-CM | POA: Diagnosis not present

## 2017-12-21 DIAGNOSIS — D509 Iron deficiency anemia, unspecified: Secondary | ICD-10-CM

## 2017-12-21 DIAGNOSIS — Z79899 Other long term (current) drug therapy: Secondary | ICD-10-CM | POA: Diagnosis not present

## 2017-12-21 LAB — CBC WITH DIFFERENTIAL/PLATELET
Basophils Absolute: 0 10*3/uL (ref 0–0.1)
Basophils Relative: 1 %
Eosinophils Absolute: 0.1 10*3/uL (ref 0–0.7)
Eosinophils Relative: 3 %
HEMATOCRIT: 31.4 % — AB (ref 35.0–47.0)
HEMOGLOBIN: 9.3 g/dL — AB (ref 12.0–16.0)
LYMPHS ABS: 1 10*3/uL (ref 1.0–3.6)
Lymphocytes Relative: 34 %
MCH: 22.4 pg — AB (ref 26.0–34.0)
MCHC: 29.7 g/dL — ABNORMAL LOW (ref 32.0–36.0)
MCV: 75.5 fL — AB (ref 80.0–100.0)
Monocytes Absolute: 0.2 10*3/uL (ref 0.2–0.9)
Monocytes Relative: 7 %
NEUTROS ABS: 1.7 10*3/uL (ref 1.4–6.5)
NEUTROS PCT: 55 %
Platelets: 263 10*3/uL (ref 150–440)
RBC: 4.16 MIL/uL (ref 3.80–5.20)
RDW: 32.4 % — ABNORMAL HIGH (ref 11.5–14.5)
WBC: 3.1 10*3/uL — AB (ref 3.6–11.0)

## 2017-12-21 LAB — FERRITIN: FERRITIN: 76 ng/mL (ref 11–307)

## 2017-12-21 LAB — IRON AND TIBC
IRON: 26 ug/dL — AB (ref 28–170)
Saturation Ratios: 7 % — ABNORMAL LOW (ref 10.4–31.8)
TIBC: 365 ug/dL (ref 250–450)
UIBC: 339 ug/dL

## 2017-12-24 ENCOUNTER — Inpatient Hospital Stay (HOSPITAL_BASED_OUTPATIENT_CLINIC_OR_DEPARTMENT_OTHER): Payer: 59 | Admitting: Oncology

## 2017-12-24 ENCOUNTER — Inpatient Hospital Stay: Payer: 59

## 2017-12-24 ENCOUNTER — Encounter: Payer: Self-pay | Admitting: Oncology

## 2017-12-24 VITALS — BP 136/87 | HR 97 | Temp 98.1°F | Resp 16 | Wt 151.9 lb

## 2017-12-24 VITALS — BP 120/80 | HR 76 | Resp 20

## 2017-12-24 DIAGNOSIS — D500 Iron deficiency anemia secondary to blood loss (chronic): Secondary | ICD-10-CM

## 2017-12-24 DIAGNOSIS — N812 Incomplete uterovaginal prolapse: Secondary | ICD-10-CM | POA: Diagnosis not present

## 2017-12-24 DIAGNOSIS — Z79899 Other long term (current) drug therapy: Secondary | ICD-10-CM

## 2017-12-24 DIAGNOSIS — M7989 Other specified soft tissue disorders: Secondary | ICD-10-CM | POA: Diagnosis not present

## 2017-12-24 DIAGNOSIS — N920 Excessive and frequent menstruation with regular cycle: Secondary | ICD-10-CM | POA: Diagnosis not present

## 2017-12-24 DIAGNOSIS — D219 Benign neoplasm of connective and other soft tissue, unspecified: Secondary | ICD-10-CM

## 2017-12-24 MED ORDER — IRON SUCROSE 20 MG/ML IV SOLN
200.0000 mg | INTRAVENOUS | Status: DC
  Administered 2017-12-24: 200 mg via INTRAVENOUS
  Filled 2017-12-24 (×2): qty 10

## 2017-12-24 MED ORDER — SODIUM CHLORIDE 0.9 % IV SOLN
Freq: Once | INTRAVENOUS | Status: AC
  Administered 2017-12-24: 15:00:00 via INTRAVENOUS
  Filled 2017-12-24: qty 1000

## 2017-12-24 NOTE — Progress Notes (Signed)
Hematology/Oncology pregress note [facility] [phone] [phone]   Patient Care Team: Danelle Berry, NP as PCP - General (Nurse Practitioner)  REFERRING PROVIDER: Charlynn Grimes NP REASON FOR VISIT Follow up for treatment of  Evaluation of anemia.  HISTORY OF PRESENTING ILLNESS:  Connie Santiago is a  52 y.o.  female with PMH listed below who was referred to me for evaluation of anemia. Reviewed patient at [place]. Patient recently had lab work done which revealed anemia with hemoglobin of 6.5, MCV 66, platelet counts 429,000, WBC 3.2, normal creatinine 0.71.  Bilirubin 0.3.  B12 795  Patient reports chronic history of iron deficiency for many years.  She has taken iron pills in the past but due to the GI toxicities she can cannot continue.  Reports heavy menstrual.  And history of fibroid uterus disease..  No aggravating or improving factors.  Associated symptoms: Patient reports fatigue.  SOB with exertion.  Context: Denies weight loss, easy bruising, hematochezia, hemoptysis, hematuria.   Context: History of GI bleeding: Denies               History of CKD denies              She works as Warden/ranger at [employer].  INTERVAL HISTORY Connie Santiago is a 52 y.o. female who has above history reviewed by me today presents for follow up visit for management of iron deficiency anemia  During the interval patient status post IV Venofer 200 mg x 4.. She reports fatigue is much better. Still have ongoing heavy menstrual period.  Ice craving has resolved.   Review of Systems  Constitutional: Positive for malaise/fatigue. Negative for chills, fever and weight loss.  HENT: Negative for congestion, ear discharge, ear pain, nosebleeds, sinus pain and sore throat.   Eyes: Negative for double vision, photophobia, pain, discharge and redness.  Respiratory: Negative for cough, hemoptysis, sputum production, shortness of  breath and wheezing.   Cardiovascular: Negative for chest pain, palpitations, orthopnea, claudication and leg swelling.  Gastrointestinal: Negative for abdominal pain, blood in stool, constipation, diarrhea, heartburn, melena, nausea and vomiting.  Genitourinary: Negative for dysuria, flank pain, frequency and hematuria.  Musculoskeletal: Negative for back pain, myalgias and neck pain.  Skin: Negative for itching and rash.  Neurological: Negative for dizziness, tingling, tremors, focal weakness, weakness and headaches.  Endo/Heme/Allergies: Negative for environmental allergies. Does not bruise/bleed easily.  Psychiatric/Behavioral: Negative for depression and hallucinations. The patient is not nervous/anxious.     MEDICAL HISTORY:  Past Medical History:  Diagnosis Date  . Anemia   . Asthma in child   . Cervical prolapse   . Fibroid   . Iron deficiency anemia due to chronic blood loss 11/21/2017  . Swollen R knee     SURGICAL HISTORY: Past Surgical History:  Procedure Laterality Date  . NO PAST SURGERIES      SOCIAL HISTORY: Social History   Socioeconomic History  . Marital status: Married    Spouse name: Not on file  . Number of children: Not on file  . Years of education: Not on file  . Highest education level: Not on file  Occupational History  . Not on file  Social Needs  . Financial resource strain: Not on file  . Food insecurity:    Worry: Not on file    Inability: Not on file  . Transportation needs:    Medical: Not on file    Non-medical: Not on file  Tobacco Use  . Smoking status: Never Smoker  . Smokeless tobacco: Never Used  Substance and Sexual Activity  . Alcohol use: No    Alcohol/week: 0.0 oz  . Drug use: No  . Sexual activity: Yes    Birth control/protection: Condom  Lifestyle  . Physical activity:    Days per week: Not on file    Minutes per session: Not on file  . Stress: Not on file  Relationships  . Social connections:    Talks on  phone: Not on file    Gets together: Not on file    Attends religious service: Not on file    Active member of club or organization: Not on file    Attends meetings of clubs or organizations: Not on file    Relationship status: Not on file  . Intimate partner violence:    Fear of current or ex partner: Not on file    Emotionally abused: Not on file    Physically abused: Not on file    Forced sexual activity: Not on file  Other Topics Concern  . Not on file  Social History Narrative  . Not on file    FAMILY HISTORY: Family History  Problem Relation Age of Onset  . Heart failure Maternal Grandmother   . Heart disease Maternal Grandmother   . Breast cancer Neg Hx   . Ovarian cancer Neg Hx   . Colon cancer Neg Hx     ALLERGIES:  is allergic to erythromycin and penicillins.  MEDICATIONS:  Current Outpatient Medications  Medication Sig Dispense Refill  . diclofenac sodium (VOLTAREN) 1 % GEL   2  . PROAIR HFA 108 (90 Base) MCG/ACT inhaler INHALE 1 PUFF BY MOUTH EVERY 6 HOURS IF NEEDED FOR SHORTNESS OF BREATH OR WHEEZING  1   No current facility-administered medications for this visit.      PHYSICAL EXAMINATION: ECOG PERFORMANCE STATUS: 1 - Symptomatic but completely ambulatory Vitals:   12/24/17 1427  BP: 136/87  Pulse: 97  Resp: 16  Temp: 98.1 F (36.7 C)   Filed Weights   12/24/17 1427  Weight: 151 lb 14.4 oz (68.9 kg)    Physical Exam  Constitutional: She is oriented to person, place, and time. She appears well-developed and well-nourished. No distress.  HENT:  Head: Normocephalic and atraumatic.  Right Ear: External ear normal.  Left Ear: External ear normal.  Mouth/Throat: Oropharynx is clear and moist.  Eyes: Pupils are equal, round, and reactive to light. EOM are normal. No scleral icterus.  Neck: Normal range of motion. Neck supple.  Cardiovascular: Normal rate, regular rhythm and normal heart sounds.  Pulmonary/Chest: Effort normal and breath sounds  normal. No respiratory distress. She has no wheezes. She has no rales. She exhibits no tenderness.  Abdominal: Soft. Bowel sounds are normal. She exhibits no distension and no mass. There is no tenderness.  Musculoskeletal: Normal range of motion. She exhibits no edema or deformity.  Lymphadenopathy:    She has no cervical adenopathy.  Neurological: She is alert and oriented to person, place, and time. No cranial nerve deficit. Coordination normal.  Skin: Skin is warm and dry. No rash noted.  Psychiatric: She has a normal mood and affect. Her behavior is normal. Thought content normal.     LABORATORY DATA:  I have reviewed the data as listed Lab Results  Component Value Date   WBC 3.1 (L) 12/21/2017   HGB 9.3 (L) 12/21/2017   HCT 31.4 (L) 12/21/2017  MCV 75.5 (L) 12/21/2017   PLT 263 12/21/2017   No results for input(s): NA, K, CL, CO2, GLUCOSE, BUN, CREATININE, CALCIUM, GFRNONAA, GFRAA, PROT, ALBUMIN, AST, ALT, ALKPHOS, BILITOT, BILIDIR, IBILI in the last 8760 hours. Iron/TIBC/Ferritin/ %Sat    Component Value Date/Time   IRON 26 (L) 12/21/2017 1052   TIBC 365 12/21/2017 1052   FERRITIN 76 12/21/2017 1052   IRONPCTSAT 7 (L) 12/21/2017 1052        ASSESSMENT & PLAN:  1. Iron deficiency anemia due to chronic blood loss    Labs reviewed and discussed with patient Hemoglobin has improved from 6.2 to 9.3, MCV improved from 61-75. Ferritin improved from 2 to76, saturation ratio 7, TIBC 365. Discussed with patient that she probably need additional IV iron to replete her iron stores. Plan IV Venofer 200 mg weekly x4.  #Advised patient to follow-up with GYN for management of menorrhagia.  She voices understanding.  She also need screening mammogram.  Discussed with patient. .   Orders Placed This Encounter  Procedures  . CBC with Differential/Platelet    Standing Status:   Future    Standing Expiration Date:   12/24/2018  . Ferritin    Standing Status:   Future     Standing Expiration Date:   12/25/2018  . Iron and TIBC    Standing Status:   Future    Standing Expiration Date:   12/25/2018  . Folate    Standing Status:   Future    Standing Expiration Date:   12/25/2018  . Vitamin B12    Standing Status:   Future    Standing Expiration Date:   12/25/2018    All questions were answered. The patient knows to call the clinic with any problems questions or concerns.  Return of visit: 5 weeks Thank you for this kind referral and the opportunity to participate in the care of this patient. A copy of today's note is routed to referring provider  Total face to face encounter time for this patient visit was 15 min. >50% of the time was  spent in counseling and coordination of care.  Earlie Server, MD, PhD Hematology Oncology [facility] at [facility] Pager- [phone] 12/24/2017

## 2018-01-02 ENCOUNTER — Inpatient Hospital Stay: Payer: 59

## 2018-01-02 VITALS — BP 122/77 | HR 80 | Resp 20

## 2018-01-02 DIAGNOSIS — Z79899 Other long term (current) drug therapy: Secondary | ICD-10-CM | POA: Diagnosis not present

## 2018-01-02 DIAGNOSIS — N812 Incomplete uterovaginal prolapse: Secondary | ICD-10-CM | POA: Diagnosis not present

## 2018-01-02 DIAGNOSIS — D219 Benign neoplasm of connective and other soft tissue, unspecified: Secondary | ICD-10-CM | POA: Diagnosis not present

## 2018-01-02 DIAGNOSIS — M7989 Other specified soft tissue disorders: Secondary | ICD-10-CM | POA: Diagnosis not present

## 2018-01-02 DIAGNOSIS — D500 Iron deficiency anemia secondary to blood loss (chronic): Secondary | ICD-10-CM

## 2018-01-02 DIAGNOSIS — N920 Excessive and frequent menstruation with regular cycle: Secondary | ICD-10-CM | POA: Diagnosis not present

## 2018-01-02 MED ORDER — IRON SUCROSE 20 MG/ML IV SOLN
200.0000 mg | INTRAVENOUS | Status: DC
  Administered 2018-01-02: 200 mg via INTRAVENOUS
  Filled 2018-01-02: qty 10

## 2018-01-02 MED ORDER — SODIUM CHLORIDE 0.9 % IV SOLN
INTRAVENOUS | Status: DC
  Administered 2018-01-02: 14:00:00 via INTRAVENOUS
  Filled 2018-01-02: qty 1000

## 2018-01-16 ENCOUNTER — Ambulatory Visit: Payer: Self-pay

## 2018-01-17 ENCOUNTER — Encounter: Payer: 59 | Admitting: Obstetrics and Gynecology

## 2018-01-18 ENCOUNTER — Inpatient Hospital Stay: Payer: 59 | Attending: Oncology

## 2018-01-18 VITALS — BP 133/83 | HR 71 | Temp 97.1°F | Resp 18

## 2018-01-18 DIAGNOSIS — D649 Anemia, unspecified: Secondary | ICD-10-CM | POA: Diagnosis not present

## 2018-01-18 DIAGNOSIS — D500 Iron deficiency anemia secondary to blood loss (chronic): Secondary | ICD-10-CM | POA: Diagnosis not present

## 2018-01-18 DIAGNOSIS — Z79899 Other long term (current) drug therapy: Secondary | ICD-10-CM | POA: Diagnosis not present

## 2018-01-18 DIAGNOSIS — N920 Excessive and frequent menstruation with regular cycle: Secondary | ICD-10-CM | POA: Diagnosis not present

## 2018-01-18 MED ORDER — IRON SUCROSE 20 MG/ML IV SOLN
200.0000 mg | INTRAVENOUS | Status: DC
  Administered 2018-01-18: 200 mg via INTRAVENOUS
  Filled 2018-01-18: qty 10

## 2018-01-18 MED ORDER — SODIUM CHLORIDE 0.9 % IV SOLN
Freq: Once | INTRAVENOUS | Status: AC
  Administered 2018-01-18: 14:00:00 via INTRAVENOUS
  Filled 2018-01-18: qty 1000

## 2018-01-23 ENCOUNTER — Inpatient Hospital Stay: Payer: 59

## 2018-01-23 VITALS — BP 122/83 | HR 86 | Resp 18

## 2018-01-23 DIAGNOSIS — D649 Anemia, unspecified: Secondary | ICD-10-CM | POA: Diagnosis not present

## 2018-01-23 DIAGNOSIS — N920 Excessive and frequent menstruation with regular cycle: Secondary | ICD-10-CM | POA: Diagnosis not present

## 2018-01-23 DIAGNOSIS — D500 Iron deficiency anemia secondary to blood loss (chronic): Secondary | ICD-10-CM | POA: Diagnosis not present

## 2018-01-23 DIAGNOSIS — Z79899 Other long term (current) drug therapy: Secondary | ICD-10-CM | POA: Diagnosis not present

## 2018-01-23 MED ORDER — IRON SUCROSE 20 MG/ML IV SOLN
200.0000 mg | Freq: Once | INTRAVENOUS | Status: AC
  Administered 2018-01-23: 200 mg via INTRAVENOUS
  Filled 2018-01-23: qty 10

## 2018-01-23 MED ORDER — IRON SUCROSE 20 MG/ML IV SOLN: 200.0000 mg | INTRAVENOUS | Status: DC

## 2018-01-23 MED ORDER — SODIUM CHLORIDE 0.9 % IV SOLN
INTRAVENOUS | Status: DC
  Administered 2018-01-23: 14:00:00 via INTRAVENOUS
  Filled 2018-01-23: qty 250

## 2018-01-28 ENCOUNTER — Inpatient Hospital Stay: Payer: 59

## 2018-01-28 DIAGNOSIS — N920 Excessive and frequent menstruation with regular cycle: Secondary | ICD-10-CM | POA: Diagnosis not present

## 2018-01-28 DIAGNOSIS — D649 Anemia, unspecified: Secondary | ICD-10-CM | POA: Diagnosis not present

## 2018-01-28 DIAGNOSIS — Z79899 Other long term (current) drug therapy: Secondary | ICD-10-CM | POA: Diagnosis not present

## 2018-01-28 DIAGNOSIS — D500 Iron deficiency anemia secondary to blood loss (chronic): Secondary | ICD-10-CM

## 2018-01-28 LAB — CBC WITH DIFFERENTIAL/PLATELET
BASOS ABS: 0 10*3/uL (ref 0–0.1)
BASOS PCT: 1 %
EOS ABS: 0.1 10*3/uL (ref 0–0.7)
EOS PCT: 2 %
HEMATOCRIT: 35.5 % (ref 35.0–47.0)
Hemoglobin: 11.4 g/dL — ABNORMAL LOW (ref 12.0–16.0)
Lymphocytes Relative: 30 %
Lymphs Abs: 1.3 10*3/uL (ref 1.0–3.6)
MCH: 27.6 pg (ref 26.0–34.0)
MCHC: 32.1 g/dL (ref 32.0–36.0)
MCV: 86.1 fL (ref 80.0–100.0)
MONO ABS: 0.3 10*3/uL (ref 0.2–0.9)
MONOS PCT: 7 %
Neutro Abs: 2.6 10*3/uL (ref 1.4–6.5)
Neutrophils Relative %: 60 %
PLATELETS: 250 10*3/uL (ref 150–440)
RBC: 4.13 MIL/uL (ref 3.80–5.20)
RDW: 25.5 % — AB (ref 11.5–14.5)
WBC: 4.4 10*3/uL (ref 3.6–11.0)

## 2018-01-28 LAB — IRON AND TIBC
IRON: 91 ug/dL (ref 28–170)
SATURATION RATIOS: 26 % (ref 10.4–31.8)
TIBC: 347 ug/dL (ref 250–450)
UIBC: 256 ug/dL

## 2018-01-28 LAB — VITAMIN B12: Vitamin B-12: 421 pg/mL (ref 180–914)

## 2018-01-28 LAB — FOLATE: FOLATE: 6.4 ng/mL (ref >5.9)

## 2018-01-28 LAB — FERRITIN: FERRITIN: 213 ng/mL (ref 11–307)

## 2018-01-29 ENCOUNTER — Other Ambulatory Visit: Payer: Self-pay

## 2018-01-30 ENCOUNTER — Inpatient Hospital Stay: Payer: 59

## 2018-01-30 ENCOUNTER — Other Ambulatory Visit: Payer: Self-pay

## 2018-01-30 ENCOUNTER — Encounter: Payer: Self-pay | Admitting: Oncology

## 2018-01-30 ENCOUNTER — Inpatient Hospital Stay (HOSPITAL_BASED_OUTPATIENT_CLINIC_OR_DEPARTMENT_OTHER): Payer: 59 | Admitting: Oncology

## 2018-01-30 VITALS — BP 125/77 | HR 85 | Temp 97.2°F | Resp 16 | Wt 159.5 lb

## 2018-01-30 DIAGNOSIS — Z79899 Other long term (current) drug therapy: Secondary | ICD-10-CM

## 2018-01-30 DIAGNOSIS — D500 Iron deficiency anemia secondary to blood loss (chronic): Secondary | ICD-10-CM

## 2018-01-30 DIAGNOSIS — D649 Anemia, unspecified: Secondary | ICD-10-CM | POA: Diagnosis not present

## 2018-01-30 DIAGNOSIS — N920 Excessive and frequent menstruation with regular cycle: Secondary | ICD-10-CM | POA: Diagnosis not present

## 2018-01-30 DIAGNOSIS — N924 Excessive bleeding in the premenopausal period: Secondary | ICD-10-CM

## 2018-01-30 NOTE — Progress Notes (Signed)
Hematology/Oncology pregress note [facility] Telephone:([phone] [phone]   Patient Care Team: Danelle Berry, NP as PCP - General (Nurse Practitioner)  REFERRING PROVIDER: Charlynn Grimes NP REASON FOR VISIT Follow up for treatment of  Evaluation of anemia.  HISTORY OF PRESENTING ILLNESS:  Connie Santiago is a  52 y.o.  female with PMH listed below who was referred to me for evaluation of anemia. Reviewed patient at [facility]. Patient recently had lab work done which revealed anemia with hemoglobin of 6.5, MCV 66, platelet counts 429,000, WBC 3.2, normal creatinine 0.71.  Bilirubin 0.3.  B12 795  Patient reports chronic history of iron deficiency for many years.  She has taken iron pills in the past but due to the GI toxicities she can cannot continue.  Reports heavy menstrual.  And history of fibroid uterus disease..  No aggravating or improving factors.  Associated symptoms: Patient reports fatigue.  SOB with exertion.  Context: Denies weight loss, easy bruising, hematochezia, hemoptysis, hematuria.   Context: History of GI bleeding: Denies               History of CKD denies              She works as Warden/ranger at [employer].  INTERVAL HISTORY Connie Santiago is a 52 y.o. female who has above history reviewed by me today presents for follow up visit for management of iron deficiency anemia During the interval patient has received additional Venofer 200 mg.  Reports energy level has significantly improved, fatigue is resolved. She still have ongoing heavy menstrual..  She has establish care with GYN.  She hesitates about hysterectomy or starting OCPs   Review of Systems  Constitutional: Negative for chills, fever, malaise/fatigue and weight loss.  HENT: Negative for congestion, ear discharge, ear pain, nosebleeds, sinus pain and sore throat.   Eyes: Negative for double vision, photophobia, pain, discharge and  redness.  Respiratory: Negative for cough, hemoptysis, sputum production, shortness of breath and wheezing.   Cardiovascular: Negative for chest pain, palpitations, orthopnea, claudication and leg swelling.  Gastrointestinal: Negative for abdominal pain, blood in stool, constipation, diarrhea, heartburn, melena, nausea and vomiting.  Genitourinary: Negative for dysuria, flank pain, frequency and hematuria.  Musculoskeletal: Negative for back pain, myalgias and neck pain.  Skin: Negative for itching and rash.  Neurological: Negative for dizziness, tingling, tremors, focal weakness, weakness and headaches.  Endo/Heme/Allergies: Negative for environmental allergies. Does not bruise/bleed easily.  Psychiatric/Behavioral: Negative for depression and hallucinations. The patient is not nervous/anxious.     MEDICAL HISTORY:  Past Medical History:  Diagnosis Date  . Anemia   . Asthma in child   . Cervical prolapse   . Fibroid   . Iron deficiency anemia due to chronic blood loss 11/21/2017  . Swollen R knee     SURGICAL HISTORY: Past Surgical History:  Procedure Laterality Date  . NO PAST SURGERIES      SOCIAL HISTORY: Social History   Socioeconomic History  . Marital status: Married    Spouse name: Not on file  . Number of children: Not on file  . Years of education: Not on file  . Highest education level: Not on file  Occupational History  . Not on file  Social Needs  . Financial resource strain: Not on file  . Food insecurity:    Worry: Not on file    Inability: Not on file  . Transportation needs:    Medical: Not  on file    Non-medical: Not on file  Tobacco Use  . Smoking status: Never Smoker  . Smokeless tobacco: Never Used  Substance and Sexual Activity  . Alcohol use: No    Alcohol/week: 0.0 standard drinks  . Drug use: No  . Sexual activity: Yes    Birth control/protection: Condom  Lifestyle  . Physical activity:    Days per week: Not on file    Minutes per  session: Not on file  . Stress: Not on file  Relationships  . Social connections:    Talks on phone: Not on file    Gets together: Not on file    Attends religious service: Not on file    Active member of club or organization: Not on file    Attends meetings of clubs or organizations: Not on file    Relationship status: Not on file  . Intimate partner violence:    Fear of current or ex partner: Not on file    Emotionally abused: Not on file    Physically abused: Not on file    Forced sexual activity: Not on file  Other Topics Concern  . Not on file  Social History Narrative  . Not on file    FAMILY HISTORY: Family History  Problem Relation Age of Onset  . Heart failure Maternal Grandmother   . Heart disease Maternal Grandmother   . Breast cancer Neg Hx   . Ovarian cancer Neg Hx   . Colon cancer Neg Hx     ALLERGIES:  is allergic to erythromycin and penicillins.  MEDICATIONS:  Current Outpatient Medications  Medication Sig Dispense Refill  . diclofenac sodium (VOLTAREN) 1 % GEL   2  . PROAIR HFA 108 (90 Base) MCG/ACT inhaler INHALE 1 PUFF BY MOUTH EVERY 6 HOURS IF NEEDED FOR SHORTNESS OF BREATH OR WHEEZING  1   No current facility-administered medications for this visit.      PHYSICAL EXAMINATION: ECOG PERFORMANCE STATUS: 1 - Symptomatic but completely ambulatory Vitals:   01/30/18 1331  BP: 125/77  Pulse: 85  Resp: 16  Temp: (!) 97.2 F (36.2 C)   Filed Weights   01/30/18 1331  Weight: 159 lb 8 oz (72.3 kg)    Physical Exam  Constitutional: She is oriented to person, place, and time. She appears well-developed and well-nourished. No distress.  HENT:  Head: Normocephalic and atraumatic.  Right Ear: External ear normal.  Left Ear: External ear normal.  Mouth/Throat: Oropharynx is clear and moist.  Eyes: Pupils are equal, round, and reactive to light. EOM are normal. No scleral icterus.  Neck: Normal range of motion. Neck supple.  Cardiovascular: Normal  rate, regular rhythm and normal heart sounds.  Pulmonary/Chest: Effort normal and breath sounds normal. No respiratory distress. She has no wheezes. She has no rales. She exhibits no tenderness.  Abdominal: Soft. Bowel sounds are normal. She exhibits no distension and no mass. There is no tenderness.  Musculoskeletal: Normal range of motion. She exhibits no edema or deformity.  Lymphadenopathy:    She has no cervical adenopathy.  Neurological: She is alert and oriented to person, place, and time. No cranial nerve deficit. Coordination normal.  Skin: Skin is warm and dry. No rash noted. No erythema.  Psychiatric: She has a normal mood and affect. Her behavior is normal. Thought content normal.     LABORATORY DATA:  I have reviewed the data as listed Lab Results  Component Value Date   WBC 4.4 01/28/2018  HGB 11.4 (L) 01/28/2018   HCT 35.5 01/28/2018   MCV 86.1 01/28/2018   PLT 250 01/28/2018   No results for input(s): NA, K, CL, CO2, GLUCOSE, BUN, CREATININE, CALCIUM, GFRNONAA, GFRAA, PROT, ALBUMIN, AST, ALT, ALKPHOS, BILITOT, BILIDIR, IBILI in the last 8760 hours. Iron/TIBC/Ferritin/ %Sat    Component Value Date/Time   IRON 91 01/28/2018 1359   TIBC 347 01/28/2018 1359   FERRITIN 213 01/28/2018 1359   IRONPCTSAT 26 01/28/2018 1359        ASSESSMENT & PLAN:  1. Iron deficiency anemia due to chronic blood loss   2. Excessive bleeding in premenopausal period   Labs reviewed and discussed with patient. Hemoglobin has significantly improved.  Ferritin level improved to 213. Hold additional IV iron. Given that she has ongoing chronic blood loss from heavy menstrual period, recommend patient to continue follow-up with GYN and discuss about her options. I recommend patient to take oral iron supplementation ferrous sulfate 325 twice daily as maintenance treatment. I can see her again in 6 months and repeat labs and assessment for need for additional iron therapy.    Orders  Placed This Encounter  Procedures  . CBC with Differential/Platelet    Standing Status:   Future    Standing Expiration Date:   01/31/2019  . Ferritin    Standing Status:   Future    Standing Expiration Date:   01/31/2019  . Iron and TIBC    Standing Status:   Future    Standing Expiration Date:   01/31/2019    All questions were answered. The patient knows to call the clinic with any problems questions or concerns.  Return of visit: 6 months  Earlie Server, MD, PhD Hematology Oncology [facility] Pager- [phone] 01/30/2018

## 2018-01-30 NOTE — Progress Notes (Signed)
Patient here today for follow up and possible venofer.

## 2018-07-31 ENCOUNTER — Inpatient Hospital Stay: Payer: 59

## 2018-08-02 ENCOUNTER — Inpatient Hospital Stay: Payer: 59

## 2018-08-02 ENCOUNTER — Inpatient Hospital Stay: Payer: 59 | Admitting: Oncology

## 2020-07-12 DIAGNOSIS — R5383 Other fatigue: Secondary | ICD-10-CM | POA: Diagnosis not present

## 2020-07-12 DIAGNOSIS — I10 Essential (primary) hypertension: Secondary | ICD-10-CM | POA: Diagnosis not present

## 2020-07-12 DIAGNOSIS — I714 Abdominal aortic aneurysm, without rupture: Secondary | ICD-10-CM | POA: Diagnosis not present

## 2020-07-12 DIAGNOSIS — R7301 Impaired fasting glucose: Secondary | ICD-10-CM | POA: Diagnosis not present

## 2020-07-12 DIAGNOSIS — E559 Vitamin D deficiency, unspecified: Secondary | ICD-10-CM | POA: Diagnosis not present

## 2020-07-12 DIAGNOSIS — I493 Ventricular premature depolarization: Secondary | ICD-10-CM | POA: Diagnosis not present

## 2020-07-12 DIAGNOSIS — R011 Cardiac murmur, unspecified: Secondary | ICD-10-CM | POA: Diagnosis not present

## 2020-07-15 DIAGNOSIS — E559 Vitamin D deficiency, unspecified: Secondary | ICD-10-CM | POA: Diagnosis not present

## 2020-07-15 DIAGNOSIS — R7301 Impaired fasting glucose: Secondary | ICD-10-CM | POA: Diagnosis not present

## 2020-07-15 DIAGNOSIS — R5383 Other fatigue: Secondary | ICD-10-CM | POA: Diagnosis not present

## 2020-07-15 DIAGNOSIS — R011 Cardiac murmur, unspecified: Secondary | ICD-10-CM | POA: Diagnosis not present

## 2020-07-16 ENCOUNTER — Other Ambulatory Visit: Payer: Self-pay | Admitting: Nurse Practitioner

## 2020-07-16 DIAGNOSIS — I714 Abdominal aortic aneurysm, without rupture, unspecified: Secondary | ICD-10-CM

## 2020-07-19 DIAGNOSIS — R5383 Other fatigue: Secondary | ICD-10-CM | POA: Diagnosis not present

## 2020-07-19 DIAGNOSIS — I361 Nonrheumatic tricuspid (valve) insufficiency: Secondary | ICD-10-CM | POA: Diagnosis not present

## 2020-07-19 DIAGNOSIS — I340 Nonrheumatic mitral (valve) insufficiency: Secondary | ICD-10-CM | POA: Diagnosis not present

## 2020-07-19 DIAGNOSIS — I071 Rheumatic tricuspid insufficiency: Secondary | ICD-10-CM | POA: Insufficient documentation

## 2020-07-19 DIAGNOSIS — E559 Vitamin D deficiency, unspecified: Secondary | ICD-10-CM | POA: Insufficient documentation

## 2020-07-29 ENCOUNTER — Ambulatory Visit: Payer: 59 | Admitting: Dermatology

## 2020-07-29 ENCOUNTER — Other Ambulatory Visit: Payer: Self-pay

## 2020-07-29 DIAGNOSIS — L905 Scar conditions and fibrosis of skin: Secondary | ICD-10-CM

## 2020-07-29 NOTE — Patient Instructions (Addendum)
Recommend serica moisturizing scar formula cream every night or [facility] silicone scar sheet every night to help with scar remodeling if desired. Scars remodel on their own for a full year.

## 2020-07-29 NOTE — Progress Notes (Signed)
   New Patient Visit  Subjective  Connie Santiago is a 55 y.o. female who presents for the following: New Patient (Initial Visit) (Patient is here today concerning a pimple on face that did not heal on left side of cheek. Patient states the spot has been there for about 6 months. ).  Not painful or itchy  Objective  Well appearing patient in no apparent distress; mood and affect are within normal limits.  A focused examination was performed including face. Relevant physical exam findings are noted in the Assessment and Plan.  Objective  Head - Anterior (Face): Hyperpigmented scar  Assessment & Plan  Scar conditions and fibrosis of skin Head - Anterior (Face)  With post-inflammatory hyperpigmentation.  Advised scar will remodel for a year after it appears. Darkness will fade over time.   Recommend serica moisturizing scar formula cream every night or [facility] silicone scar sheet every night to help with scar remodeling if desired. Scars remodel on their own for a full year.  Deferred treatment for post-inflammatory hyperpigmentation today.  Return for follow up as needed .   I, Ruthell Rummage, CMA, am acting as scribe for Forest Gleason, MD.  Documentation: I have reviewed the above documentation for accuracy and completeness, and I agree with the above.  Forest Gleason, MD

## 2020-08-02 ENCOUNTER — Other Ambulatory Visit: Payer: Self-pay

## 2020-08-02 ENCOUNTER — Ambulatory Visit
Admission: RE | Admit: 2020-08-02 | Discharge: 2020-08-02 | Disposition: A | Discharge status: Home / Self Care | Payer: 59 | Source: Ambulatory Visit | Attending: Nurse Practitioner | Admitting: Nurse Practitioner

## 2020-08-02 DIAGNOSIS — I714 Abdominal aortic aneurysm, without rupture, unspecified: Secondary | ICD-10-CM

## 2020-08-02 DIAGNOSIS — R0989 Other specified symptoms and signs involving the circulatory and respiratory systems: Secondary | ICD-10-CM | POA: Diagnosis not present

## 2020-12-21 DIAGNOSIS — M25561 Pain in right knee: Secondary | ICD-10-CM | POA: Diagnosis not present

## 2020-12-21 DIAGNOSIS — M79606 Pain in leg, unspecified: Secondary | ICD-10-CM | POA: Diagnosis not present

## 2020-12-21 DIAGNOSIS — R609 Edema, unspecified: Secondary | ICD-10-CM | POA: Diagnosis not present

## 2020-12-21 DIAGNOSIS — R5383 Other fatigue: Secondary | ICD-10-CM | POA: Diagnosis not present

## 2020-12-21 DIAGNOSIS — M25562 Pain in left knee: Secondary | ICD-10-CM | POA: Diagnosis not present

## 2020-12-21 DIAGNOSIS — D509 Iron deficiency anemia, unspecified: Secondary | ICD-10-CM | POA: Diagnosis not present

## 2020-12-22 ENCOUNTER — Other Ambulatory Visit: Payer: Self-pay | Admitting: Nurse Practitioner

## 2020-12-22 DIAGNOSIS — M79604 Pain in right leg: Secondary | ICD-10-CM

## 2020-12-24 ENCOUNTER — Encounter: Payer: Self-pay | Admitting: Oncology

## 2020-12-27 ENCOUNTER — Other Ambulatory Visit: Payer: Self-pay

## 2020-12-27 ENCOUNTER — Ambulatory Visit
Admission: RE | Admit: 2020-12-27 | Discharge: 2020-12-27 | Disposition: A | Discharge status: Home / Self Care | Payer: 59 | Source: Ambulatory Visit | Attending: Nurse Practitioner | Admitting: Nurse Practitioner

## 2020-12-27 DIAGNOSIS — M79605 Pain in left leg: Secondary | ICD-10-CM | POA: Diagnosis not present

## 2020-12-27 DIAGNOSIS — M79604 Pain in right leg: Secondary | ICD-10-CM | POA: Diagnosis not present

## 2020-12-29 DIAGNOSIS — M170 Bilateral primary osteoarthritis of knee: Secondary | ICD-10-CM | POA: Diagnosis not present

## 2020-12-30 ENCOUNTER — Telehealth: Payer: Self-pay

## 2020-12-30 ENCOUNTER — Other Ambulatory Visit: Payer: Self-pay

## 2020-12-30 DIAGNOSIS — D500 Iron deficiency anemia secondary to blood loss (chronic): Secondary | ICD-10-CM

## 2020-12-30 NOTE — Telephone Encounter (Signed)
-----   Message from Earlie Server, MD sent at 12/30/2020  1:41 PM EDT ----- Looks like she is still anemic.  See if she would like to make a follow up appt with me Also please see if there is ferritin iron tibc pending at [facility]. It is unusual that she has cbc checked. If no I want her to do iron labs before MD visit thanks.

## 2020-12-30 NOTE — Telephone Encounter (Signed)
[facility] message sent to patient notifying her of appts.

## 2020-12-30 NOTE — Telephone Encounter (Signed)
Can you schedule labs 1-2 days prior to MD. Please add venofer to MD visit date. I see that she was scheduled for possible venofer to last no show/cancel appt.

## 2020-12-30 NOTE — Telephone Encounter (Signed)
Per Dr. Tasia Catchings pt is still anemic and will like for pt to f/u with her as well as iron studies labs before visit. Contacted [facility] to check for pending results. Only pending test is CBC. Spoke to pt and informed her of anemic results, pt hung up while being transferred to front desk. Scheduled on 8/9 at 2:30 for labs prior to visit. Schedulers left a VM for pt and provided appt details.

## 2021-01-04 NOTE — Telephone Encounter (Signed)
Patient replied to [place] message stating that she called to cancel appts. Per cancellation note: will CB to reschedule when she is ready to come back

## 2021-01-11 ENCOUNTER — Other Ambulatory Visit: Payer: 59

## 2021-01-11 ENCOUNTER — Inpatient Hospital Stay: Payer: 59

## 2021-01-11 ENCOUNTER — Ambulatory Visit: Payer: 59 | Admitting: Oncology

## 2021-01-12 ENCOUNTER — Ambulatory Visit: Payer: 59

## 2021-01-12 ENCOUNTER — Ambulatory Visit: Payer: 59 | Admitting: Oncology

## 2021-01-12 ENCOUNTER — Other Ambulatory Visit: Payer: 59

## 2021-01-22 DIAGNOSIS — F5101 Primary insomnia: Secondary | ICD-10-CM | POA: Diagnosis not present

## 2021-04-01 IMAGING — US US AORTA
1 series · 14 of 25 positions shown · non-contrast
Comparison: None.

CLINICAL DATA: Abdominal bruit on physical examination. Evaluate
for abdominal aortic aneurysm.

EXAM:
ULTRASOUND OF ABDOMINAL AORTA
TECHNIQUE: Ultrasound examination of the abdominal aorta and proximal common
iliac arteries was performed to evaluate for aneurysm. Additional
color and Doppler images of the distal aorta were obtained to
document patency.

[Series 1: us aorta · 0.25mm/px · 14 of 29 slices shown]
[im 1/29]
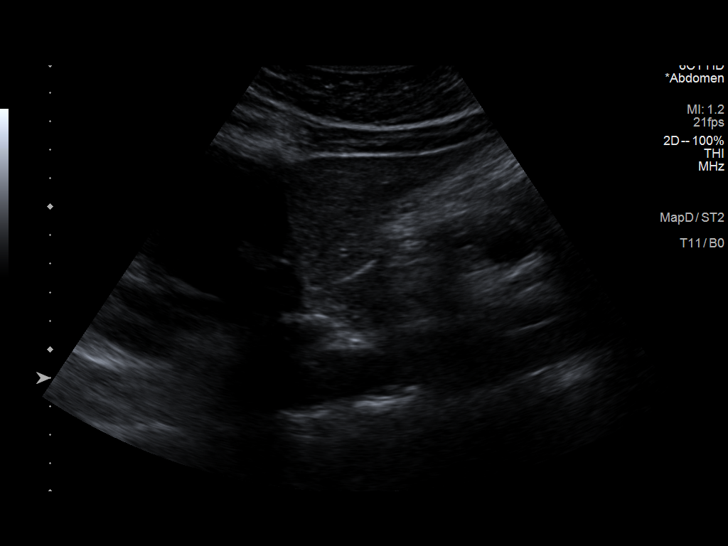
[im 3/29]
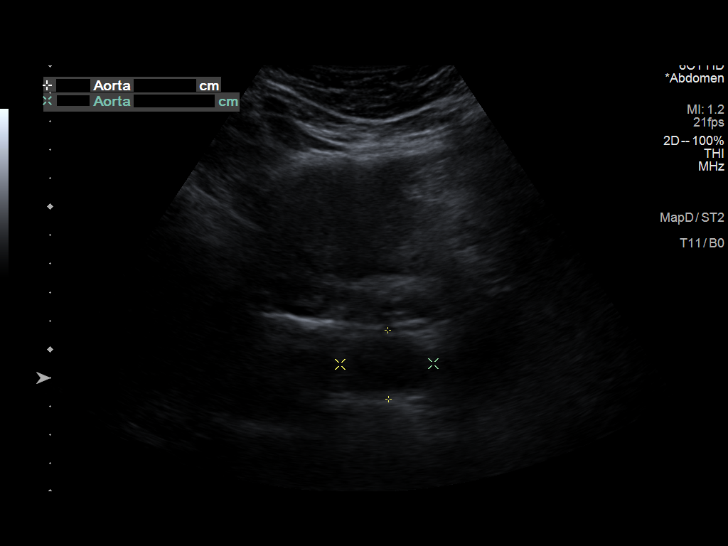
[im 5/29]
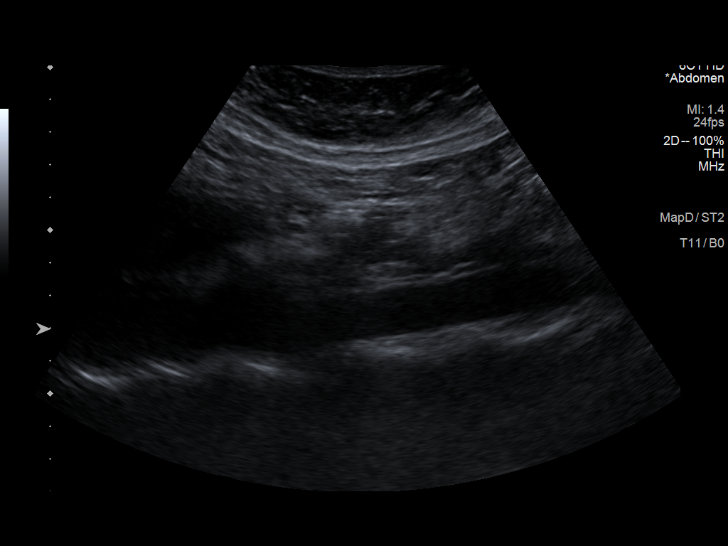
[im 8/29]
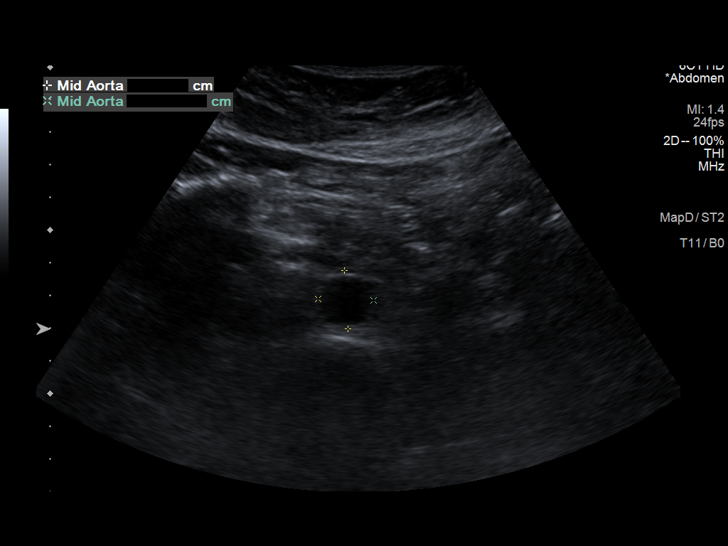
[im 10/29]
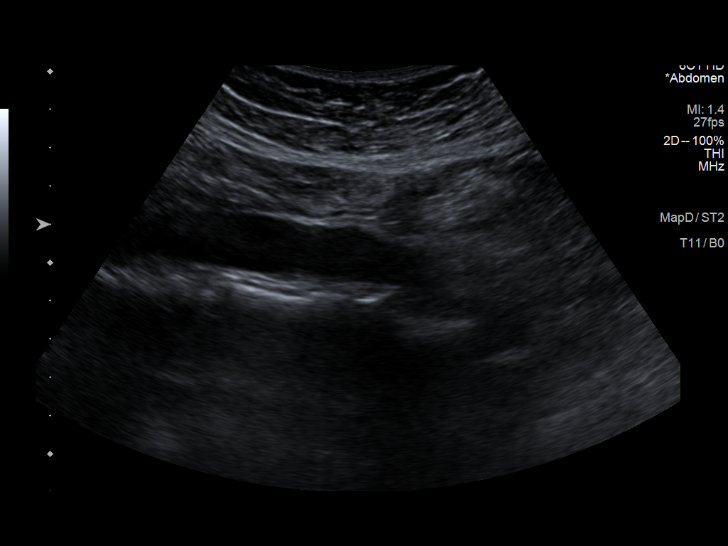
[im 11/29]
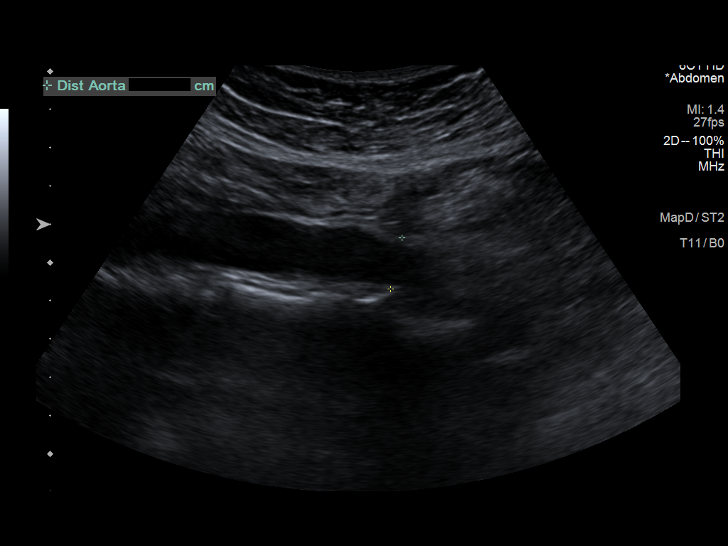
[im 13/29]
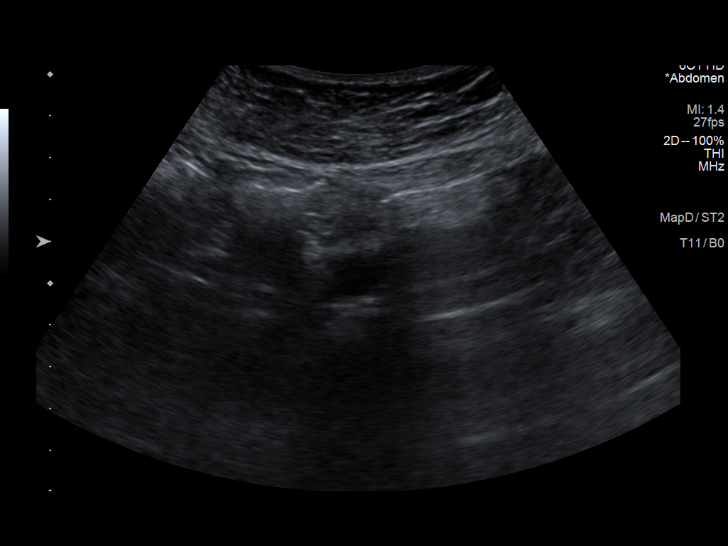
[im 16/29]
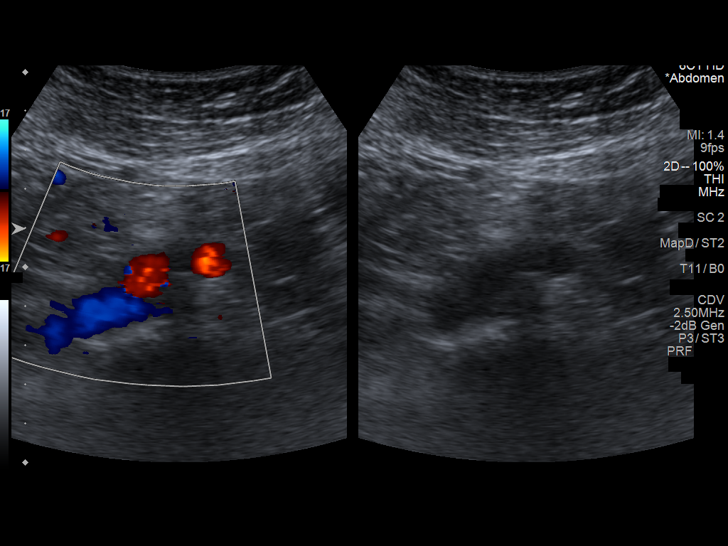
[im 18/29]
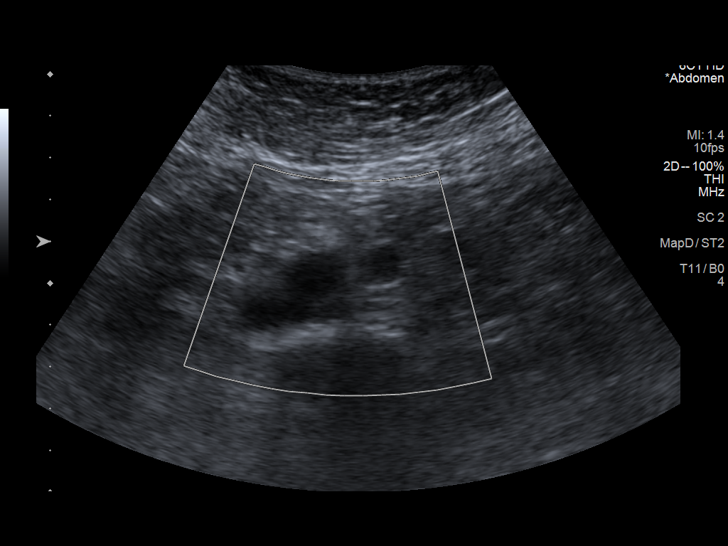
[im 19/29]
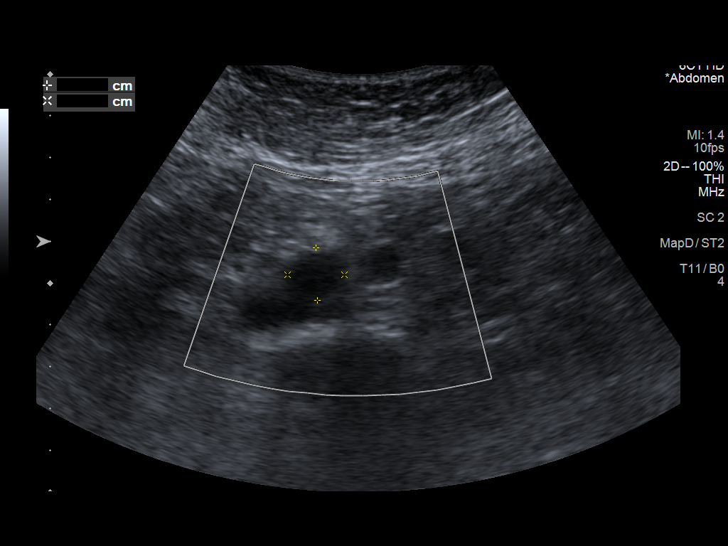
[im 22/29]
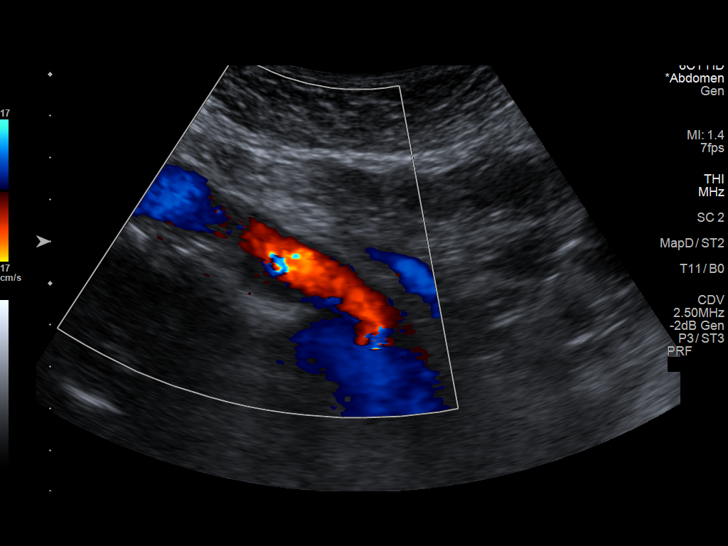
[im 24/29]
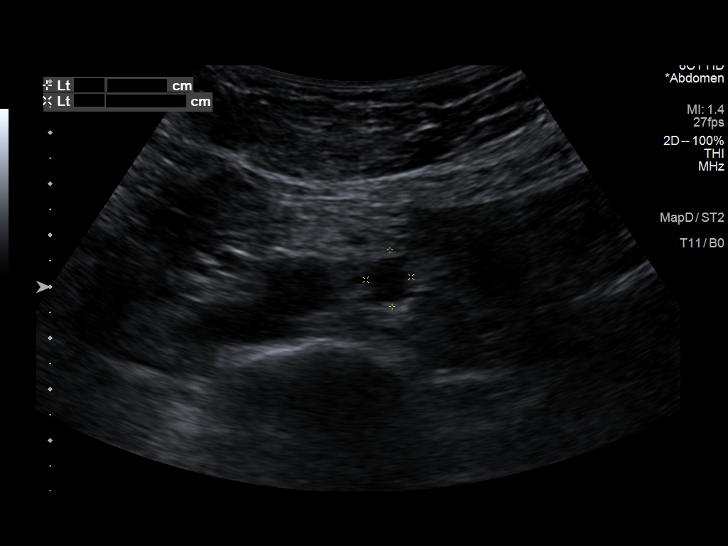
[im 26/29]
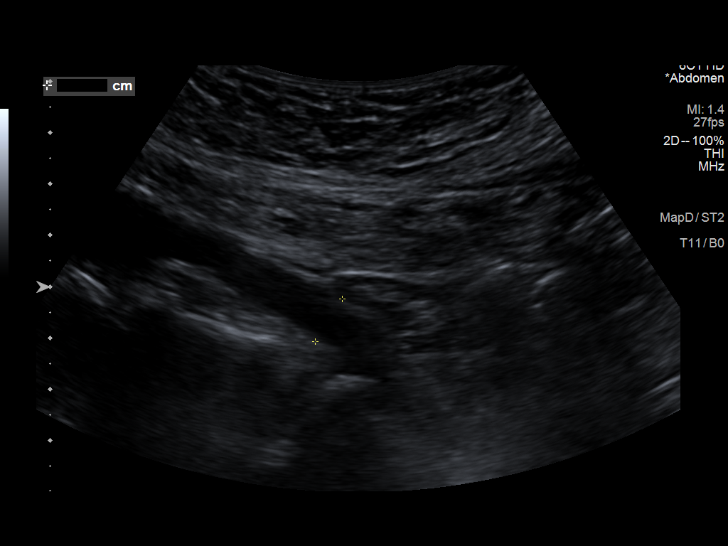
[im 29/29]
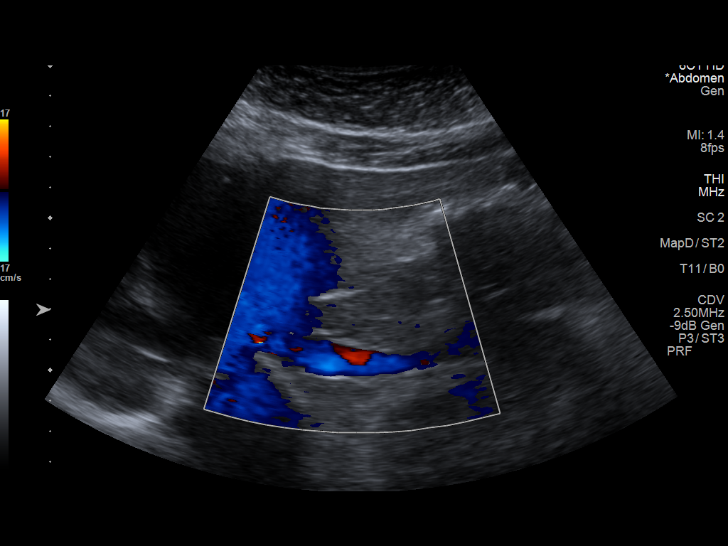

[14 of 25 positions shown; findings below may reference images not displayed]

FINDINGS: Abdominal aortic measurements as follows:

Proximal:  3.3 x 2.4 cm

Mid:  1.8 x 1.7 cm

Distal:  1.6 x 1.4 cm
Patent: Yes, peak systolic velocity is 135 cm/s

Right common iliac artery: 1.4 x 1.3 cm

Left common iliac artery: 1.1 x 0.9 cm
IMPRESSION: No evidence of abdominal aortic aneurysm.

## 2021-09-10 DIAGNOSIS — J4521 Mild intermittent asthma with (acute) exacerbation: Secondary | ICD-10-CM | POA: Diagnosis not present

## 2021-10-05 DIAGNOSIS — B370 Candidal stomatitis: Secondary | ICD-10-CM | POA: Diagnosis not present

## 2021-10-05 DIAGNOSIS — N760 Acute vaginitis: Secondary | ICD-10-CM | POA: Diagnosis not present

## 2021-10-05 DIAGNOSIS — B9689 Other specified bacterial agents as the cause of diseases classified elsewhere: Secondary | ICD-10-CM | POA: Diagnosis not present

## 2021-12-12 ENCOUNTER — Other Ambulatory Visit: Payer: Self-pay | Admitting: Nurse Practitioner

## 2021-12-12 DIAGNOSIS — R5383 Other fatigue: Secondary | ICD-10-CM | POA: Diagnosis not present

## 2021-12-12 DIAGNOSIS — D509 Iron deficiency anemia, unspecified: Secondary | ICD-10-CM | POA: Diagnosis not present

## 2021-12-12 DIAGNOSIS — E663 Overweight: Secondary | ICD-10-CM | POA: Diagnosis not present

## 2021-12-12 DIAGNOSIS — M25561 Pain in right knee: Secondary | ICD-10-CM | POA: Diagnosis not present

## 2021-12-12 DIAGNOSIS — M25562 Pain in left knee: Secondary | ICD-10-CM | POA: Diagnosis not present

## 2021-12-12 DIAGNOSIS — Z1231 Encounter for screening mammogram for malignant neoplasm of breast: Secondary | ICD-10-CM

## 2021-12-12 DIAGNOSIS — I340 Nonrheumatic mitral (valve) insufficiency: Secondary | ICD-10-CM | POA: Diagnosis not present

## 2022-01-04 DIAGNOSIS — M170 Bilateral primary osteoarthritis of knee: Secondary | ICD-10-CM | POA: Diagnosis not present

## 2022-01-04 DIAGNOSIS — M25562 Pain in left knee: Secondary | ICD-10-CM | POA: Diagnosis not present

## 2022-01-10 DIAGNOSIS — D509 Iron deficiency anemia, unspecified: Secondary | ICD-10-CM | POA: Diagnosis not present

## 2022-01-10 DIAGNOSIS — R5383 Other fatigue: Secondary | ICD-10-CM | POA: Diagnosis not present

## 2022-01-10 DIAGNOSIS — E663 Overweight: Secondary | ICD-10-CM | POA: Diagnosis not present

## 2022-01-12 DIAGNOSIS — R5383 Other fatigue: Secondary | ICD-10-CM | POA: Diagnosis not present

## 2022-01-12 DIAGNOSIS — M25562 Pain in left knee: Secondary | ICD-10-CM | POA: Diagnosis not present

## 2022-01-12 DIAGNOSIS — Z1272 Encounter for screening for malignant neoplasm of vagina: Secondary | ICD-10-CM | POA: Diagnosis not present

## 2022-01-12 DIAGNOSIS — M25561 Pain in right knee: Secondary | ICD-10-CM | POA: Diagnosis not present

## 2022-01-12 DIAGNOSIS — E663 Overweight: Secondary | ICD-10-CM | POA: Diagnosis not present

## 2022-01-12 DIAGNOSIS — B029 Zoster without complications: Secondary | ICD-10-CM | POA: Diagnosis not present

## 2022-01-12 DIAGNOSIS — D509 Iron deficiency anemia, unspecified: Secondary | ICD-10-CM | POA: Diagnosis not present

## 2022-01-12 DIAGNOSIS — Z0001 Encounter for general adult medical examination with abnormal findings: Secondary | ICD-10-CM | POA: Diagnosis not present

## 2022-05-18 ENCOUNTER — Encounter: Payer: Self-pay | Admitting: Oncology

## 2022-05-20 ENCOUNTER — Encounter: Payer: Self-pay | Admitting: Oncology

## 2022-11-08 ENCOUNTER — Encounter: Payer: Self-pay | Admitting: Oncology

## 2022-11-09 ENCOUNTER — Encounter: Payer: BC Managed Care – PPO | Admitting: Nurse Practitioner

## 2022-11-17 ENCOUNTER — Encounter: Payer: BC Managed Care – PPO | Admitting: Internal Medicine

## 2022-11-17 ENCOUNTER — Ambulatory Visit (INDEPENDENT_AMBULATORY_CARE_PROVIDER_SITE_OTHER): Payer: BC Managed Care – PPO | Admitting: Family

## 2022-11-17 ENCOUNTER — Encounter: Payer: BC Managed Care – PPO | Admitting: Nurse Practitioner

## 2022-11-17 ENCOUNTER — Encounter: Payer: Self-pay | Admitting: Family

## 2022-11-17 VITALS — BP 138/84 | HR 88 | Ht 64.0 in | Wt 172.0 lb

## 2022-11-17 DIAGNOSIS — Z0000 Encounter for general adult medical examination without abnormal findings: Secondary | ICD-10-CM

## 2022-11-17 DIAGNOSIS — E538 Deficiency of other specified B group vitamins: Secondary | ICD-10-CM | POA: Diagnosis not present

## 2022-11-17 DIAGNOSIS — R7303 Prediabetes: Secondary | ICD-10-CM

## 2022-11-17 DIAGNOSIS — Z1211 Encounter for screening for malignant neoplasm of colon: Secondary | ICD-10-CM | POA: Diagnosis not present

## 2022-11-17 DIAGNOSIS — R5383 Other fatigue: Secondary | ICD-10-CM | POA: Diagnosis not present

## 2022-11-17 DIAGNOSIS — E782 Mixed hyperlipidemia: Secondary | ICD-10-CM

## 2022-11-17 DIAGNOSIS — E559 Vitamin D deficiency, unspecified: Secondary | ICD-10-CM | POA: Diagnosis not present

## 2022-11-17 MED ORDER — NYSTATIN 100000 UNIT/ML MT SUSP: 5.0000 mL | Freq: Three times a day (TID) | OROMUCOSAL | 0 refills | Status: DC

## 2022-11-17 NOTE — Progress Notes (Signed)
Established Patient Office Visit  Subjective:  Patient ID: Connie Santiago, female    DOB: 05-Feb-1966  Age: 57 y.o. MRN: [id number]  Chief Complaint  Patient presents with   Annual Exam    CPE    Patient here today for her CPE. She has been feeling well in general. Does not have any major concerns today.   She is due for labs and refills.   No other concerns at this time.   Past Medical History:  Diagnosis Date   Anemia    Asthma in child    Cervical prolapse    Fibroid    Iron deficiency anemia due to chronic blood loss 11/21/2017   Swollen R knee     Past Surgical History:  Procedure Laterality Date   NO PAST SURGERIES      Social History   Socioeconomic History   Marital status: Married    Spouse name: Not on file   Number of children: Not on file   Years of education: Not on file   Highest education level: Not on file  Occupational History   Not on file  Tobacco Use   Smoking status: Never   Smokeless tobacco: Never  Substance and Sexual Activity   Alcohol use: No    Alcohol/week: 0.0 standard drinks of alcohol   Drug use: No   Sexual activity: Yes    Birth control/protection: Condom  Other Topics Concern   Not on file  Social History Narrative   Not on file   Social Determinants of Health   Financial Resource Strain: Not on file  Food Insecurity: Not on file  Transportation Needs: Not on file  Physical Activity: Not on file  Stress: Not on file  Social Connections: Not on file  Intimate Partner Violence: Not on file    Family History  Problem Relation Age of Onset   Heart failure Maternal Grandmother    Heart disease Maternal Grandmother    Breast cancer Neg Hx    Ovarian cancer Neg Hx    Colon cancer Neg Hx     Allergies  Allergen Reactions   Erythromycin Itching   Penicillins Itching    SOB  SOB  SOB    Review of Systems  All other systems reviewed and are negative.      Objective:   BP 138/84   Pulse 88    Ht 5\' 4"  (1.626 m)   Wt 172 lb (78 kg)   SpO2 (!) 86%   BMI 29.52 kg/m   Vitals:   11/17/22 1148  BP: 138/84  Pulse: 88  Height: 5\' 4"  (1.626 m)  Weight: 172 lb (78 kg)  SpO2: (!) 86%  BMI (Calculated): 29.51    Physical Exam Vitals and nursing note reviewed.  Constitutional:      Appearance: Normal appearance. She is normal weight.  HENT:     Head: Normocephalic and atraumatic.     Nose: Nose normal.  Eyes:     Pupils: Pupils are equal, round, and reactive to light.  Cardiovascular:     Rate and Rhythm: Normal rate.  Pulmonary:     Effort: Pulmonary effort is normal.  Musculoskeletal:        General: Normal range of motion.  Neurological:     General: No focal deficit present.     Mental Status: She is alert and oriented to person, place, and time. Mental status is at baseline.  Psychiatric:        Mood  and Affect: Mood normal.        Behavior: Behavior normal.        Thought Content: Thought content normal.        Judgment: Judgment normal.      Results for orders placed or performed in visit on 11/17/22  Cologuard  Result Value Ref Range   COLOGUARD Negative Negative  Lipid panel  Result Value Ref Range   Cholesterol, Total 151 100 - 199 mg/dL   Triglycerides 84 0 - 149 mg/dL   HDL 33 (L) >16 mg/dL   VLDL Cholesterol Cal 16 5 - 40 mg/dL   LDL Chol Calc (NIH) 606 (H) 0 - 99 mg/dL   Chol/HDL Ratio 4.6 (H) 0.0 - 4.4 ratio  CBC With Differential  Result Value Ref Range   WBC 3.9 3.4 - 10.8 x10E3/uL   RBC 4.30 3.77 - 5.28 x10E6/uL   Hemoglobin 11.2 11.1 - 15.9 g/dL   Hematocrit 30.1 60.1 - 46.6 %   MCV 86 79 - 97 fL   MCH 26.0 (L) 26.6 - 33.0 pg   MCHC 30.4 (L) 31.5 - 35.7 g/dL   RDW 09.3 (H) 23.5 - 57.3 %   Neutrophils 59 Not Estab. %   Lymphs 31 Not Estab. %   Monocytes 7 Not Estab. %   Eos 2 Not Estab. %   Basos 1 Not Estab. %   Neutrophils Absolute 2.3 1.4 - 7.0 x10E3/uL   Lymphocytes Absolute 1.2 0.7 - 3.1 x10E3/uL   Monocytes Absolute 0.3  0.1 - 0.9 x10E3/uL   EOS (ABSOLUTE) 0.1 0.0 - 0.4 x10E3/uL   Basophils Absolute 0.0 0.0 - 0.2 x10E3/uL   Immature Granulocytes 0 Not Estab. %   Immature Grans (Abs) 0.0 0.0 - 0.1 x10E3/uL  CMP14+EGFR  Result Value Ref Range   Glucose 94 70 - 99 mg/dL   BUN 15 6 - 24 mg/dL   Creatinine, Ser 2.20 0.57 - 1.00 mg/dL   eGFR 97 >25 KY/HCW/2.37   BUN/Creatinine Ratio 21 9 - 23   Sodium 141 134 - 144 mmol/L   Potassium 4.1 3.5 - 5.2 mmol/L   Chloride 104 96 - 106 mmol/L   CO2 27 20 - 29 mmol/L   Calcium 9.6 8.7 - 10.2 mg/dL   Total Protein 7.1 6.0 - 8.5 g/dL   Albumin 4.0 3.8 - 4.9 g/dL   Globulin, Total 3.1 1.5 - 4.5 g/dL   Bilirubin Total <6.2 0.0 - 1.2 mg/dL   Alkaline Phosphatase 62 44 - 121 IU/L   AST 14 0 - 40 IU/L   ALT 16 0 - 32 IU/L  TSH  Result Value Ref Range   TSH 2.410 0.450 - 4.500 uIU/mL  Hemoglobin A1c  Result Value Ref Range   Hgb A1c MFr Bld 6.0 (H) 4.8 - 5.6 %   Est. average glucose Bld gHb Est-mCnc 126 mg/dL  VITAMIN D 25 Hydroxy (Vit-D Deficiency, Fractures)  Result Value Ref Range   Vit D, 25-Hydroxy 17.0 (L) 30.0 - 100.0 ng/mL  Vitamin B12  Result Value Ref Range   Vitamin B-12 863 232 - 1,245 pg/mL  Iron, TIBC and Ferritin Panel  Result Value Ref Range   Total Iron Binding Capacity 409 250 - 450 ug/dL   UIBC 831 517 - 616 ug/dL   Iron 42 27 - 073 ug/dL   Iron Saturation 10 (L) 15 - 55 %   Ferritin 42 15 - 150 ng/mL    Recent Results (from the past 2160 hour(s))  Lipid panel     Status: Abnormal   Collection Time: 11/17/22  1:32 PM  Result Value Ref Range   Cholesterol, Total 151 100 - 199 mg/dL   Triglycerides 84 0 - 149 mg/dL   HDL 33 (L) >09 mg/dL   VLDL Cholesterol Cal 16 5 - 40 mg/dL   LDL Chol Calc (NIH) 811 (H) 0 - 99 mg/dL   Chol/HDL Ratio 4.6 (H) 0.0 - 4.4 ratio    Comment:                                   T. Chol/HDL Ratio                                             Men  Women                               1/2 Avg.Risk  3.4    3.3                                    Avg.Risk  5.0    4.4                                2X Avg.Risk  9.6    7.1                                3X Avg.Risk 23.4   11.0   CBC With Differential     Status: Abnormal   Collection Time: 11/17/22  1:32 PM  Result Value Ref Range   WBC 3.9 3.4 - 10.8 x10E3/uL   RBC 4.30 3.77 - 5.28 x10E6/uL   Hemoglobin 11.2 11.1 - 15.9 g/dL   Hematocrit 91.4 78.2 - 46.6 %   MCV 86 79 - 97 fL   MCH 26.0 (L) 26.6 - 33.0 pg   MCHC 30.4 (L) 31.5 - 35.7 g/dL   RDW 95.6 (H) 21.3 - 08.6 %   Neutrophils 59 Not Estab. %   Lymphs 31 Not Estab. %   Monocytes 7 Not Estab. %   Eos 2 Not Estab. %   Basos 1 Not Estab. %   Neutrophils Absolute 2.3 1.4 - 7.0 x10E3/uL   Lymphocytes Absolute 1.2 0.7 - 3.1 x10E3/uL   Monocytes Absolute 0.3 0.1 - 0.9 x10E3/uL   EOS (ABSOLUTE) 0.1 0.0 - 0.4 x10E3/uL   Basophils Absolute 0.0 0.0 - 0.2 x10E3/uL   Immature Granulocytes 0 Not Estab. %   Immature Grans (Abs) 0.0 0.0 - 0.1 x10E3/uL    Comment: **Effective January 01, 2023, profile [id number] CBC/Differential**   (No Platelet) will be made non-orderable. [facility] Offers:   [id number] CBC With Differential/Platelet   CMP14+EGFR     Status: None   Collection Time: 11/17/22  1:32 PM  Result Value Ref Range   Glucose 94 70 - 99 mg/dL   BUN 15 6 - 24 mg/dL   Creatinine, Ser 6.29 0.57 - 1.00 mg/dL   eGFR 97 >52 WU/XLK/4.40   BUN/Creatinine Ratio 21 9 - 23  Sodium 141 134 - 144 mmol/L   Potassium 4.1 3.5 - 5.2 mmol/L   Chloride 104 96 - 106 mmol/L   CO2 27 20 - 29 mmol/L   Calcium 9.6 8.7 - 10.2 mg/dL   Total Protein 7.1 6.0 - 8.5 g/dL   Albumin 4.0 3.8 - 4.9 g/dL   Globulin, Total 3.1 1.5 - 4.5 g/dL   Bilirubin Total <5.6 0.0 - 1.2 mg/dL   Alkaline Phosphatase 62 44 - 121 IU/L   AST 14 0 - 40 IU/L   ALT 16 0 - 32 IU/L  TSH     Status: None   Collection Time: 11/17/22  1:32 PM  Result Value Ref Range   TSH 2.410 0.450 - 4.500 uIU/mL  Hemoglobin A1c     Status: Abnormal   Collection  Time: 11/17/22  1:32 PM  Result Value Ref Range   Hgb A1c MFr Bld 6.0 (H) 4.8 - 5.6 %    Comment:          Prediabetes: 5.7 - 6.4          Diabetes: >6.4          Glycemic control for adults with diabetes: <7.0    Est. average glucose Bld gHb Est-mCnc 126 mg/dL  VITAMIN D 25 Hydroxy (Vit-D Deficiency, Fractures)     Status: Abnormal   Collection Time: 11/17/22  1:32 PM  Result Value Ref Range   Vit D, 25-Hydroxy 17.0 (L) 30.0 - 100.0 ng/mL    Comment: Vitamin D deficiency has been defined by the Institute of Medicine and an Endocrine Society practice guideline as a level of serum 25-OH vitamin D less than 20 ng/mL (1,2). The Endocrine Society went on to further define vitamin D insufficiency as a level between 21 and 29 ng/mL (2). 1. IOM (Institute of Medicine). 2010. Dietary reference    intakes for calcium and D. [place]: The    [employer]. 2. Holick MF, Binkley [place], Bischoff-Ferrari HA, et al.    Evaluation, treatment, and prevention of vitamin D    deficiency: an Endocrine Society clinical practice    guideline. JCEM. 2011 Jul; 96(7):1911-30.   Vitamin B12     Status: None   Collection Time: 11/17/22  1:32 PM  Result Value Ref Range   Vitamin B-12 863 232 - 1,245 pg/mL  Iron, TIBC and Ferritin Panel     Status: Abnormal   Collection Time: 11/17/22  1:32 PM  Result Value Ref Range   Total Iron Binding Capacity 409 250 - 450 ug/dL   UIBC 387 564 - 332 ug/dL   Iron 42 27 - 951 ug/dL   Iron Saturation 10 (L) 15 - 55 %   Ferritin 42 15 - 150 ng/mL  Cologuard     Status: None   Collection Time: 11/24/22 12:00 PM  Result Value Ref Range   COLOGUARD Negative Negative    Comment:  NEGATIVE TEST RESULT. A negative Cologuard result indicates a low likelihood that a colorectal cancer (CRC) or advanced adenoma (adenomatous polyps with more advanced pre-malignant features)  is present. The chance that a person with a negative Cologuard test has a colorectal cancer  is less than 1 in 1500 (negative predictive value >99.9%) or has an  advanced adenoma is less than  5.3% (negative predictive value 94.7%). These data are based on a prospective cross-sectional study of 10,000 individuals at average risk for colorectal cancer who were screened with both Cologuard and colonoscopy. (Imperiale T. et  al, N Engl J Med 2014;370(14):1286-1297) The normal value (reference range) for this assay is negative.  COLOGUARD RE-SCREENING RECOMMENDATION: Periodic colorectal cancer screening is an important part of preventive healthcare for asymptomatic individuals at average risk for colorectal cancer.  Following a negative Cologuard result, the American Cancer Society and U.S.  Multi-Society Task Force screening guidelines recommend a Cologuard re-screening interval of 3 years.  References: American Cancer Society Guideline for Colorectal Cancer Screening: https://www.cancer.org/cancer/colon-rectal-cancer/detection-diagnosis-staging/acs-recommendations.html.; Rex DK, Boland CR, Dominitz JK, Colorectal Cancer Screening: Recommendations for Physicians and Patients from the U.S. Multi-Society Task Force on Colorectal Cancer Screening , Am J Gastroenterology 2017; 112:1016-1030.  TEST DESCRIPTION: Composite algorithmic analysis of stool DNA-biomarkers with hemoglobin immunoassay.   Quantitative values of individual biomarkers are not reportable and are not associated with individual biomarker result reference ranges. Cologuard is intended for colorectal cancer screening of adults of either sex, 45 years or older, who are at average-risk for colorectal cancer (CRC). Cologuard has been approved for use by the U.S. FDA. The performance of Cologuard was  established in a cross sectional study of average-risk adults aged 73-84. Cologuard performance in patients ages 35 to 86 years was estimated by sub-group analysis of near-age groups. Colonoscopies performed for a positive result may find as the most  clinically significant lesion: colorectal cancer [4.0%], advanced adenoma (including sessile serrated polyps greater than or equal to 1cm diameter) [20%] or non- advanced adenoma [31%]; or no colorectal neoplasia [45%]. These estimates are derived from a prospective cross-sectional screening study of 10,000 individuals at average risk for colorectal cancer who were screened with both Cologuard and colonoscopy. (Imperiale T. et al, Macy Mis J Med 2014;370(14):1286-1297.) Cologuard may produce a false negative or false positive result (no colorectal cancer or precancerous polyp present at colonoscopy follow up). A negative Cologuard test result does not guarantee the absence of CRC or advanced adenoma (pre-cancer). The current Cologuard  screening interval is every 3 years. Science writer and U.[place]. Therapist, music). Cologuard performance data in a 10,000 patient pivotal study using colonoscopy as the reference method can be accessed at the following location: www.exactlabs.com/results. Additional description of the Cologuard test process, warnings and precautions can be found at www.cologuard.com.        Assessment & Plan:   Problem List Items Addressed This Visit       Active Problems   Vitamin D deficiency   Relevant Orders   CBC With Differential (Completed)   CMP14+EGFR (Completed)   VITAMIN D 25 Hydroxy (Vit-D Deficiency, Fractures) (Completed)   Other Visit Diagnoses     Screening for malignant neoplasm of colon    -  Primary   Relevant Orders   Cologuard (Completed)   CBC With Differential (Completed)   CMP14+EGFR (Completed)   B12 deficiency due to diet       Relevant Orders   CBC With Differential (Completed)   CMP14+EGFR (Completed)   Vitamin B12 (Completed)   Prediabetes       Relevant Orders   CBC With Differential (Completed)   CMP14+EGFR (Completed)   Hemoglobin A1c (Completed)   Vitamin D deficiency, unspecified       Relevant Orders   CBC With  Differential (Completed)   CMP14+EGFR (Completed)   Mixed hyperlipidemia       Relevant Orders   Lipid panel (Completed)   CBC With Differential (Completed)   CMP14+EGFR (Completed)   Other fatigue       Relevant Orders   CBC With Differential (Completed)  CMP14+EGFR (Completed)   TSH (Completed)   Iron, TIBC and Ferritin Panel (Completed)   Routine general medical examination at a health care facility       Colon cancer screening           Return in 6 months (on 05/19/2023).    Miki Kins, FNP  11/17/2022   This document may have been prepared by Dragon Voice Recognition software and as such may include unintentional dictation errors.

## 2022-11-18 LAB — CBC WITH DIFFERENTIAL
Basophils Absolute: 0 10*3/uL (ref 0.0–0.2)
Basos: 1 %
EOS (ABSOLUTE): 0.1 10*3/uL (ref 0.0–0.4)
Eos: 2 %
Hematocrit: 36.8 % (ref 34.0–46.6)
Hemoglobin: 11.2 g/dL (ref 11.1–15.9)
Immature Grans (Abs): 0 10*3/uL (ref 0.0–0.1)
Immature Granulocytes: 0 %
Lymphocytes Absolute: 1.2 10*3/uL (ref 0.7–3.1)
Lymphs: 31 %
MCH: 26 pg — ABNORMAL LOW (ref 26.6–33.0)
MCHC: 30.4 g/dL — ABNORMAL LOW (ref 31.5–35.7)
MCV: 86 fL (ref 79–97)
Monocytes Absolute: 0.3 10*3/uL (ref 0.1–0.9)
Monocytes: 7 %
Neutrophils Absolute: 2.3 10*3/uL (ref 1.4–7.0)
Neutrophils: 59 %
RBC: 4.3 x10E6/uL (ref 3.77–5.28)
RDW: 16.3 % — ABNORMAL HIGH (ref 11.7–15.4)
WBC: 3.9 10*3/uL (ref 3.4–10.8)

## 2022-11-18 LAB — CMP14+EGFR
ALT: 16 IU/L (ref 0–32)
AST: 14 IU/L (ref 0–40)
Albumin: 4 g/dL (ref 3.8–4.9)
Alkaline Phosphatase: 62 IU/L (ref 44–121)
BUN/Creatinine Ratio: 21 (ref 9–23)
BUN: 15 mg/dL (ref 6–24)
Bilirubin Total: <0.2 mg/dL (ref 0.0–1.2)
CO2: 27 mmol/L (ref 20–29)
Calcium: 9.6 mg/dL (ref 8.7–10.2)
Chloride: 104 mmol/L (ref 96–106)
Creatinine, Ser: 0.73 mg/dL (ref 0.57–1.00)
Globulin, Total: 3.1 g/dL (ref 1.5–4.5)
Glucose: 94 mg/dL (ref 70–99)
Potassium: 4.1 mmol/L (ref 3.5–5.2)
Sodium: 141 mmol/L (ref 134–144)
Total Protein: 7.1 g/dL (ref 6.0–8.5)
eGFR: 97 mL/min/{1.73_m2} (ref >59)

## 2022-11-18 LAB — LIPID PANEL
Chol/HDL Ratio: 4.6 ratio — ABNORMAL HIGH (ref 0.0–4.4)
Cholesterol, Total: 151 mg/dL (ref 100–199)
HDL: 33 mg/dL — ABNORMAL LOW (ref >39)
LDL Chol Calc (NIH): 102 mg/dL — ABNORMAL HIGH (ref 0–99)
Triglycerides: 84 mg/dL (ref 0–149)
VLDL Cholesterol Cal: 16 mg/dL (ref 5–40)

## 2022-11-18 LAB — IRON,TIBC AND FERRITIN PANEL
Ferritin: 42 ng/mL (ref 15–150)
Iron Saturation: 10 % — ABNORMAL LOW (ref 15–55)
Iron: 42 ug/dL (ref 27–159)
Total Iron Binding Capacity: 409 ug/dL (ref 250–450)
UIBC: 367 ug/dL (ref 131–425)

## 2022-11-18 LAB — HEMOGLOBIN A1C
Est. average glucose Bld gHb Est-mCnc: 126 mg/dL
Hgb A1c MFr Bld: 6 % — ABNORMAL HIGH (ref 4.8–5.6)

## 2022-11-18 LAB — VITAMIN D 25 HYDROXY (VIT D DEFICIENCY, FRACTURES): Vit D, 25-Hydroxy: 17 ng/mL — ABNORMAL LOW (ref 30.0–100.0)

## 2022-11-18 LAB — TSH: TSH: 2.41 u[IU]/mL (ref 0.450–4.500)

## 2022-11-18 LAB — VITAMIN B12: Vitamin B-12: 863 pg/mL (ref 232–1245)

## 2022-11-28 LAB — COLOGUARD: COLOGUARD: NEGATIVE

## 2022-11-29 NOTE — Patient Instructions (Signed)

## 2023-01-10 ENCOUNTER — Telehealth: Payer: Self-pay

## 2023-01-11 ENCOUNTER — Encounter: Payer: Self-pay | Admitting: Emergency Medicine

## 2023-01-11 ENCOUNTER — Other Ambulatory Visit: Payer: Self-pay

## 2023-01-11 ENCOUNTER — Telehealth: Payer: Self-pay | Admitting: Family

## 2023-01-11 DIAGNOSIS — D259 Leiomyoma of uterus, unspecified: Secondary | ICD-10-CM | POA: Diagnosis not present

## 2023-01-11 DIAGNOSIS — N939 Abnormal uterine and vaginal bleeding, unspecified: Secondary | ICD-10-CM | POA: Diagnosis present

## 2023-01-11 NOTE — Telephone Encounter (Signed)
Patient called in needing a referral to OBGYN for vaginal bleeding. Can we send referral to [facility]?

## 2023-01-11 NOTE — ED Triage Notes (Addendum)
Patient ambulatory to triage with steady gait, without difficulty or distress noted; pt reports vag bleeding since Thursday with hx fibroids and anemia; last normal period was October

## 2023-01-12 ENCOUNTER — Emergency Department
Admission: EM | Admit: 2023-01-12 | Discharge: 2023-01-12 | Disposition: A | Discharge status: Home / Self Care | Payer: Commercial Managed Care - PPO | Attending: Emergency Medicine | Admitting: Emergency Medicine

## 2023-01-12 ENCOUNTER — Other Ambulatory Visit: Payer: Self-pay | Admitting: Cardiology

## 2023-01-12 ENCOUNTER — Encounter: Payer: Self-pay | Admitting: Oncology

## 2023-01-12 ENCOUNTER — Emergency Department: Payer: Commercial Managed Care - PPO

## 2023-01-12 DIAGNOSIS — N939 Abnormal uterine and vaginal bleeding, unspecified: Secondary | ICD-10-CM

## 2023-01-12 DIAGNOSIS — D259 Leiomyoma of uterus, unspecified: Secondary | ICD-10-CM

## 2023-01-12 LAB — BPAM RBC
Blood Product Expiration Date: 202409062359
Blood Product Expiration Date: 202409062359
ISSUE DATE / TIME: 202408090329
ISSUE DATE / TIME: 202408090540
Unit Type and Rh: 5100
Unit Type and Rh: 5100

## 2023-01-12 LAB — PREGNANCY, URINE: Preg Test, Ur: NEGATIVE

## 2023-01-12 LAB — ABO/RH: ABO/RH(D): O POS

## 2023-01-12 LAB — PREPARE RBC (CROSSMATCH)

## 2023-01-12 LAB — HCG, QUANTITATIVE, PREGNANCY: hCG, Beta Chain, Quant, S: <1 m[IU]/mL (ref <5)

## 2023-01-12 MED ORDER — SODIUM CHLORIDE 0.9 % IV SOLN
10.0000 mL/h | Freq: Once | INTRAVENOUS | Status: AC
  Administered 2023-01-12: 10 mL/h via INTRAVENOUS

## 2023-01-12 MED ORDER — NORETHINDRONE ACETATE 5 MG PO TABS: 5.0000 mg | ORAL_TABLET | Freq: Four times a day (QID) | ORAL | 0 refills | Status: DC

## 2023-01-12 MED ORDER — NORETHINDRONE ACETATE 5 MG PO TABS
5.0000 mg | ORAL_TABLET | Freq: Once | ORAL | Status: AC
  Administered 2023-01-12: 5 mg via ORAL
  Filled 2023-01-12: qty 1

## 2023-01-12 NOTE — ED Provider Notes (Signed)
[facility] Provider Note    Event Date/Time   First MD Initiated Contact with Patient 01/12/23 [phone]     (approximate)   History   Vaginal Bleeding   HPI  Connie Santiago is a 57 y.o. female   Past medical history of uterine fibroids, iron deficiency anemia, who presents to the emergency department with vaginal bleeding.  Started last week and has been soaking several pads throughout the day for the last 1 week.  She feels lightheaded, similar to when she has been anemic in the past.  She takes oral iron supplementation for iron deficiency anemia.  She follows with gynecology years ago but has had no follow-up in the past several years.  She denies pain.   External Medical Documents Reviewed: Ultrasound pelvic obtained in 2012 that showed a fibroid uterus      Physical Exam   Triage Vital Signs: ED Triage Vitals  Encounter Vitals Group     BP 01/12/23 0001 (!) 118/90     Systolic BP Percentile --      Diastolic BP Percentile --      Pulse Rate 01/12/23 0001 (!) 117     Resp 01/12/23 0001 20     Temp 01/12/23 0001 98.9 F (37.2 C)     Temp Source 01/12/23 0001 Oral     SpO2 01/12/23 0001 100 %     Weight 01/11/23 2355 170 lb (77.1 kg)     Height 01/11/23 2355 5\' 3"  (1.6 m)     Head Circumference --      Peak Flow --      Pain Score 01/11/23 2355 0     Pain Loc --      Pain Education --      Exclude from Growth Chart --     Most recent vital signs: Vitals:   01/12/23 0001 01/12/23 0339  BP: (!) 118/90 118/85  Pulse: (!) 117 90  Resp: 20 17  Temp: 98.9 F (37.2 C) 98.6 F (37 C)  SpO2: 100% 100%    General: Awake, no distress.  CV:  Good peripheral perfusion.  Resp:  Normal effort.  Abd:  No distention.  Other:  Conjunctivae are slightly pale, she is mildly tachycardic at 110, otherwise appears comfortable nontoxic pleasant.  Soft nontender abdomen.  Her PAD has blood, there is no active hemorrhage.   ED Results /  Procedures / Treatments   Labs (all labs ordered are listed, but only abnormal results are displayed) Labs Reviewed  CBC WITH DIFFERENTIAL/PLATELET - Abnormal; Notable for the following components:      Result Value   RBC 2.34 (*)    Hemoglobin 6.5 (*)    HCT 21.6 (*)    RDW 15.9 (*)    All other components within normal limits  COMPREHENSIVE METABOLIC PANEL - Abnormal; Notable for the following components:   Potassium 3.4 (*)    Glucose, Bld 119 (*)    Calcium 8.2 (*)    Total Protein 6.1 (*)    Albumin 3.2 (*)    Alkaline Phosphatase 30 (*)    All other components within normal limits  URINALYSIS, ROUTINE W REFLEX MICROSCOPIC - Abnormal; Notable for the following components:   Color, Urine RED (*)    APPearance HAZY (*)    Glucose, UA   (*)    Value: TEST NOT REPORTED DUE TO COLOR INTERFERENCE OF URINE PIGMENT   Hgb urine dipstick   (*)    Value:  TEST NOT REPORTED DUE TO COLOR INTERFERENCE OF URINE PIGMENT   Bilirubin Urine   (*)    Value: TEST NOT REPORTED DUE TO COLOR INTERFERENCE OF URINE PIGMENT   Ketones, ur   (*)    Value: TEST NOT REPORTED DUE TO COLOR INTERFERENCE OF URINE PIGMENT   Protein, ur   (*)    Value: TEST NOT REPORTED DUE TO COLOR INTERFERENCE OF URINE PIGMENT   Nitrite   (*)    Value: TEST NOT REPORTED DUE TO COLOR INTERFERENCE OF URINE PIGMENT   Leukocytes,Ua   (*)    Value: TEST NOT REPORTED DUE TO COLOR INTERFERENCE OF URINE PIGMENT   Bacteria, UA RARE (*)    All other components within normal limits  PREGNANCY, URINE  HCG, QUANTITATIVE, PREGNANCY  TYPE AND SCREEN  PREPARE RBC (CROSSMATCH)  ABO/RH     I ordered and reviewed the above labs they are notable for anemia 6.5 from 11 in June  RADIOLOGY I independently reviewed and interpreted ultrasound and see a fibroid uterus I also reviewed radiologist's formal read.   PROCEDURES:  Critical Care performed: Yes, see critical care procedure note(s)  .Critical Care  Performed by: Pilar Jarvis, MD Authorized by: Pilar Jarvis, MD   Critical care provider statement:    Critical care time (minutes):  30   Critical care was time spent personally by me on the following activities:  Development of treatment plan with patient or surrogate, discussions with consultants, evaluation of patient's response to treatment, examination of patient, ordering and review of laboratory studies, ordering and review of radiographic studies, ordering and performing treatments and interventions, pulse oximetry, re-evaluation of patient's condition and review of old charts    MEDICATIONS ORDERED IN ED: Medications  0.9 %  sodium chloride infusion (has no administration in time range)  norethindrone (AYGESTIN) tablet 5 mg (has no administration in time range)    External physician / consultants:  I spoke with Doreene Burke of gynecology regarding care plan for this patient.   IMPRESSION / MDM / ASSESSMENT AND PLAN / ED COURSE  I reviewed the triage vital signs and the nursing notes.                                Patient's presentation is most consistent with acute presentation with potential threat to life or bodily function.  Differential diagnosis includes, but is not limited to, blood loss anemia, fibroid uterus, abnormal uterine bleeding   The patient is on the cardiac monitor to evaluate for evidence of arrhythmia and/or significant heart rate changes.  MDM: Patient with abnormal uterine bleeding with known fibroid uterus and is now anemic, symptomatic, tachycardic.  Ordered for 2 units of blood transfusion, as well as transvaginal ultrasound.  Assuming fibroid uterus, will consult with gynecology regarding hormonal therapy to initiate, as well as to establish follow-up.   -- Patient stable hemodynamics improved with tachycardia resolved, got blood transfusion, consulted with gynecology who recommends hormonal therapy (fibroids on [place])  and prescription written.  She will follow-up  with gynecology.     FINAL CLINICAL IMPRESSION(S) / ED DIAGNOSES   Final diagnoses:  Abnormal uterine bleeding  Uterine leiomyoma, unspecified location     Rx / DC Orders   ED Discharge Orders          Ordered    norethindrone (AYGESTIN) 5 MG tablet  Every 6 hours  01/12/23 0348             Note:  This document was prepared using Dragon voice recognition software and may include unintentional dictation errors.    Pilar Jarvis, MD 01/12/23 [phone]

## 2023-01-12 NOTE — ED Notes (Signed)
Patient transported to Ultrasound 

## 2023-01-12 NOTE — Discharge Instructions (Signed)
You have uterine fibroids which is causing your bleeding.  You got a blood transfusion today.  I spoke with gynecology who recommended that you start on a hormone therapy which can help stabilize your uterus and decrease bleeding.  Please call gynecology for a follow-up appointment and discuss further management of your vaginal bleeding and uterine fibroids.   Thank you for choosing [place] for your health care today!  Please see your primary doctor this week for a follow up appointment.   If you have any new, worsening, or unexpected symptoms call your doctor right away or come back to the emergency department for reevaluation.  It was my pleasure to care for you today.   Daneil Dan [place] Charon, MD

## 2023-01-15 NOTE — Telephone Encounter (Signed)
Patient has appt in September with OBGYN

## 2023-01-22 ENCOUNTER — Telehealth: Payer: Self-pay

## 2023-01-22 NOTE — Telephone Encounter (Signed)
That's fine

## 2023-01-22 NOTE — Telephone Encounter (Signed)
Patient was seen in ED 01/12/23 for bleeding d/t fibroids. She was given Aygestin 5mg  q6hrs x7 days. She is scheduled for ED F/U with [facility] 02/13/23. She did stop bleeding after taking meds. However, she is now bleeding again. She is passing dark blood (she is aware is old) it is bright red when she wipes and having cramping, but it has slowed down. She is inquiring if she can be seen sooner than 02/13/23. She doesn't want to end up in ED again. Patient aware Dr. Valentino Saxon is out of the office until Wednesday 01/24/23.

## 2023-01-23 NOTE — Progress Notes (Signed)
ANNUAL PREVENTATIVE CARE GYNECOLOGY  ENCOUNTER NOTE  Subjective:       Connie Santiago is a 57 y.o. [id number] female here for evaluation of abnormal uterine bleeding. She has a past medical history of uterine fibroids, iron deficiency anemia. She presented to the Emergency Department with vaginal bleeding on 01/12/2023. Started 3 weeks prior and had been soaking several pads throughout the day for the last week.  Received a blood transfusion of 2 units in the ER. Was prescribed Aygestin  q 6 hours for 1 week. She has completed the course of Aygestin, which helped with her bleeding but since running out of her medication and has resumed leading this Sunday. This week she feels lightheaded, similar to when she has been anemic in the past. She takes oral iron supplementation for iron deficiency anemia. She denies pain. Ultrasound was also done at ED and it noted small fibroid in uterus.  Last menstrual cycle prior to this episode of bleeding was in October.       Gynecologic History Patient's last menstrual period was 03/05/2022 (approximate). Contraception: none Last Pap: 01/03/2022. Results were: normal Last mammogram: 04/19/2016. Results were: normal Last Colonoscopy: Never done Last Dexa Scan: Never done   Obstetric History OB History  Gravida Para Term Preterm AB Living  8 7 7   1 7   SAB IAB Ectopic Multiple Live Births  1       7    # Outcome Date GA Lbr Len/2nd Weight Sex Type Anes PTL Lv  8 SAB 2013          7 Term 2009   8 lb (3.629 kg) M Vag-Spont   LIV  6 Term 2002   8 lb 9.6 oz (3.901 kg) M Vag-Spont   LIV  5 Term 1996   6 lb 1.9 oz (2.776 kg) F Vag-Spont   LIV  4 Term 1995   7 lb 11.2 oz (3.493 kg) M Vag-Spont   LIV  3 Term 1992   6 lb (2.722 kg) F Vag-Spont   LIV  2 Term 1990   7 lb (3.175 kg) M Vag-Spont   LIV  1 Term 1989   6 lb (2.722 kg) F Vag-Spont   LIV    Past Medical History:  Diagnosis Date   Anemia    Asthma in child    Cervical prolapse    Fibroid     Iron deficiency anemia due to chronic blood loss 11/21/2017   Swollen R knee     Family History  Problem Relation Age of Onset   Heart failure Maternal Grandmother    Heart disease Maternal Grandmother    Breast cancer Neg Hx    Ovarian cancer Neg Hx    Colon cancer Neg Hx     Past Surgical History:  Procedure Laterality Date   NO PAST SURGERIES      Social History   Socioeconomic History   Marital status: Married    Spouse name: Not on file   Number of children: Not on file   Years of education: Not on file   Highest education level: Not on file  Occupational History   Not on file  Tobacco Use   Smoking status: Never   Smokeless tobacco: Never  Substance and Sexual Activity   Alcohol use: No    Alcohol/week: 0.0 standard drinks of alcohol   Drug use: No   Sexual activity: Yes    Birth control/protection: Condom  Other Topics Concern  Not on file  Social History Narrative   Not on file   Social Determinants of Health   Financial Resource Strain: Not on file  Food Insecurity: Not on file  Transportation Needs: Not on file  Physical Activity: Not on file  Stress: Not on file  Social Connections: Not on file  Intimate Partner Violence: Not on file    Current Outpatient Medications on File Prior to Visit  Medication Sig Dispense Refill   magic mouthwash (nystatin, lidocaine, diphenhydrAMINE, alum & mag hydroxide) suspension Swish and spit 5 mLs 3 (three) times daily. 180 mL 0   norethindrone (AYGESTIN) 5 MG tablet Take 1 tablet (5 mg total) by mouth every 6 (six) hours for 7 days. 28 tablet 0   No current facility-administered medications on file prior to visit.    Allergies  Allergen Reactions   Erythromycin Itching   Penicillins Itching    SOB  SOB  SOB      Review of Systems ROS Review of Systems - General ROS: negative for - chills, fatigue, fever, hot flashes, night sweats, weight gain or weight loss Psychological ROS: negative for -  anxiety, decreased libido, depression, mood swings, physical abuse or sexual abuse Ophthalmic ROS: negative for - blurry vision, eye pain or loss of vision ENT ROS: negative for - headaches, hearing change, visual changes or vocal changes Allergy and Immunology ROS: negative for - hives, itchy/watery eyes or seasonal allergies Hematological and Lymphatic ROS: negative for - bleeding problems, bruising, swollen lymph nodes or weight loss.  Endocrine ROS: negative for - galactorrhea, hair pattern changes, hot flashes, malaise/lethargy, mood swings, palpitations, polydipsia/polyuria, skin changes, temperature intolerance or unexpected weight changes Breast ROS: negative for - new or changing breast lumps or nipple discharge Respiratory ROS: negative for - cough or shortness of breath Cardiovascular ROS: negative for - chest pain, irregular heartbeat, palpitations or shortness of breath Gastrointestinal ROS: no abdominal pain, change in bowel habits, or black or bloody stools Genito-Urinary ROS: no dysuria, trouble voiding, or hematuria. Positive for vaginal bleeding Musculoskeletal ROS: negative for - joint pain or joint stiffness Neurological ROS: negative for - bowel and bladder control changes Dermatological ROS: negative for rash and skin lesion changes   Objective:   BP 116/82   Pulse (!) 107   Ht 5\' 3"  (1.6 m)   Wt 166 lb 1.6 oz (75.3 kg)   LMP 03/05/2022 (Approximate) Comment: post menopausal  BMI 29.42 kg/m  CONSTITUTIONAL: Well-developed, well-nourished female in no acute distress.  PSYCHIATRIC: Normal mood and affect. Normal behavior. Normal judgment and thought content. NEUROLGIC: Alert and oriented to person, place, and time. Normal muscle tone coordination. No cranial nerve deficit noted. HENT:  Normocephalic, atraumatic, External right and left ear normal. Oropharynx is clear and moist EYES: Conjunctivae and EOM are normal. Pupils are equal, round, and reactive to light. No  scleral icterus.  NECK: Normal range of motion, supple, no masses.  Normal thyroid.  SKIN: Skin is warm and dry. No rash noted. Not diaphoretic. No erythema. No pallor. CARDIOVASCULAR: Normal heart rate noted, regular rhythm, no murmur. RESPIRATORY: Clear to auscultation bilaterally. Effort and breath sounds normal, no problems with respiration noted. BREASTS: Symmetric in size. No masses, skin changes, nipple drainage, or lymphadenopathy. ABDOMEN: Soft, normal bowel sounds, no distention noted.  No tenderness, rebound or guarding.  BLADDER: Normal PELVIC:  Bladder no bladder distension noted  Urethra: normal appearing urethra with no masses, tenderness or lesions  Vulva: normal appearing vulva with no masses,  tenderness or lesions.   Vagina: normal appearing vagina with no lesions or discharge, moderate amount of blood in vault.   Cervix: normal appearing cervix without discharge or lesions  Uterus: uterus is normal size, shape, consistency and nontender  Adnexa: normal adnexa in size, nontender and no masses  RV: External Exam NormaI  MUSCULOSKELETAL: Normal range of motion. No tenderness.  No cyanosis, clubbing, or edema.  2+ distal pulses. LYMPHATIC: No Axillary, Supraclavicular, or Inguinal Adenopathy.   Labs: Lab Results  Component Value Date   WBC 5.1 01/12/2023   HGB 6.5 (L) 01/12/2023   HCT 21.6 (L) 01/12/2023   MCV 92.3 01/12/2023   PLT 288 01/12/2023    Lab Results  Component Value Date   CREATININE 0.77 01/12/2023   BUN 11 01/12/2023   NA 138 01/12/2023   K 3.4 (L) 01/12/2023   CL 105 01/12/2023   CO2 25 01/12/2023    Lab Results  Component Value Date   ALT 11 01/12/2023   AST 16 01/12/2023   ALKPHOS 30 (L) 01/12/2023   BILITOT 0.3 01/12/2023    Lab Results  Component Value Date   CHOL 151 11/17/2022   HDL 33 (L) 11/17/2022   LDLCALC 102 (H) 11/17/2022   TRIG 84 11/17/2022   CHOLHDL 4.6 (H) 11/17/2022    Lab Results  Component Value Date   TSH  2.410 11/17/2022    Lab Results  Component Value Date   HGBA1C 6.0 (H) 11/17/2022     Results for orders placed or performed in visit on 01/24/23  POCT hemoglobin  Result Value Ref Range   Hemoglobin 7.2 (A) 11 - 14.6 g/dL     Imaging:  US PELVIC COMPLETE W TRANSVAGINAL AND TORSION R/O CLINICAL DATA:  Postmenopausal vaginal bleeding  EXAM: TRANSABDOMINAL AND TRANSVAGINAL ULTRASOUND OF PELVIS  DOPPLER ULTRASOUND OF OVARIES  TECHNIQUE: Both transabdominal and transvaginal ultrasound examinations of the pelvis were performed. Transabdominal technique was performed for global imaging of the pelvis including uterus, ovaries, adnexal regions, and pelvic cul-de-sac.  It was necessary to proceed with endovaginal exam following the transabdominal exam to visualize the endometrium and ovaries. Color and duplex Doppler ultrasound was utilized to evaluate blood flow to the ovaries.  COMPARISON:  None Available.  FINDINGS: Uterus  Measurements: 12.1 x 7.0 x 8.0 cm = volume: 351 mL. The uterus is anteverted. Multiple nabothian cysts are seen within the cervix. The cervix is otherwise unremarkable. The myometrial echotexture is coarsened. There is at least 1 discrete heterogeneously hypoechoic mass within the right fundus measuring at least 2.5 x 2.5 x 2.3 cm in keeping with a intramural/submucosal uterine fibroid. The submucosal component distorts the endometrial stripe and may extend into the cavity itself, best seen on cine sequence # 2 and # 6.  Endometrium  Thickness: Best visualized within the fundus transabdominally measuring up to 9 mm. The endometrial stripe is distorted with possible intracavitary extension of the uterine fibroid mentioned above.  Right ovary  Not visualized.  No adnexal mass.  Left ovary  Measurements: 4.5 x 2.7 x 2.8 cm = volume: 18 mL. A 3.3 cm simple cyst is seen within the left ovary.  Pulsed Doppler evaluation of the visualized left  ovary demonstrates normal low-resistance arterial and venous waveforms.  Other findings  No abnormal free fluid.  IMPRESSION: 1. 2.5 cm intramural/submucosal uterine fibroid with possible intracavitary extension. 2. 9 mm endometrial stripe. In the setting of post-menopausal bleeding, endometrial sampling is indicated to exclude carcinoma. If results are  benign, sonohysterogram should be considered for focal lesion work-up. (Ref: Radiological Reasoning: Algorithmic Workup of Abnormal Vaginal Bleeding with Endovaginal Sonography and Sonohysterography. AJR 2008; 409:W11-91) 3. Left ovarian benign functional cyst measuring 3.3 cm. Recommend follow-up pelvic ultrasound in 3-6 months. Reference: Radiology 2019 Nov;293(2):359-371 4. Nonvisualization of the right ovary. No adnexal mass.  Electronically Signed   By: Helyn Numbers M.D.   On: 01/12/2023 03:39    Assessment:   1. Perimenopausal menorrhagia   2. Intramural and submucous leiomyoma of uterus   3. Iron deficiency anemia due to chronic blood loss   4. History of blood transfusion   5. Encounter for screening mammogram for malignant neoplasm of breast      Plan:  1. Perimenopausal menorrhagia - POCT hemoglobin performed today due to symptoms. Hgb 7.2. Currently taking oral liquid iron daily. Given samples of Fusion Plus to take daily. Declines iron infusion for now as she has had to do these before in the past. - Discussed need for endometrial biopsy to rule out hyperplasia or malignancy, given option of performing today or returning in 1-2 weeks to complete. Patient ok to perform today, see procedure note below.  - Labs reviewed.  - Briefly discussed management options of abnormal uterine bleeding including hormonal supplementation (progesterone tablets, IUD, GnRH agonists, and surgical intervention with endometrial ablation or hysterectomy.  Patient unsure at this point. Does desire to resume medication for now and consider  all options over time. Will f/u in 1 month to discuss other options.   2. Intramural and submucous leiomyoma of uterus - Small fibroid noted, 2.5 cm, intramural with possible submucosal component. Can cause some level of dysfunctional bleeding but may not be major cause.   3. Iron deficiency anemia due to chronic blood loss - Chronic anemia secondary to blood loss. Recently received blood transfusion.  Currently bleeding again. Changed from liquid iron to high dose PO iron, given samples of Fusion Plus, will also prescribe.   4. History of blood transfusion - Received 2 units PRBCS at recent ER visit.   5. Encounter for screening mammogram for malignant neoplasm of breast - Overdue for mammogram. Order placed.  - MM 3D SCREENING MAMMOGRAM BILATERAL BREAST     Endometrial Biopsy Procedure Note  The patient is positioned on the exam table in the dorsal lithotomy position. Bimanual exam confirms uterine position and size. A Graves speculum is placed into the vagina. A single toothed tenaculum is placed onto the anterior lip of the cervix. The pipette is placed into the endocervical canal and is advanced to the uterine fundus. Using a piston like technique, with vacuum created by withdrawing the stylus, the endometrial specimen is obtained and transferred to the biopsy container. Minimal bleeding is encountered. The procedure is well tolerated.   Uterine Position:mid anterior posterior   Uterine Length: 12 cm   Uterine Specimen: Lush  Post procedure instructions are given. The patient is scheduled for follow up appointment in 1 month (can be virtual or in person).   A total of 45 minutes were spent during this encounter, including review of previous progress notes, recent imaging and labs, face-to-face with time with patient involving counseling and coordination of care, as well as documentation for current visit.   Hildred Laser, MD [facility]

## 2023-01-23 NOTE — Telephone Encounter (Signed)
Patient has been scheduled for 01/24/23 @2 :15. Patient aware per Cleda Daub.

## 2023-01-24 ENCOUNTER — Encounter: Payer: Self-pay | Admitting: Oncology

## 2023-01-24 ENCOUNTER — Other Ambulatory Visit (HOSPITAL_COMMUNITY)
Admission: RE | Admit: 2023-01-24 | Discharge: 2023-01-24 | Disposition: A | Discharge status: Home / Self Care | Payer: BC Managed Care – PPO | Source: Ambulatory Visit | Attending: Obstetrics and Gynecology | Admitting: Obstetrics and Gynecology

## 2023-01-24 ENCOUNTER — Encounter: Payer: Self-pay | Admitting: Obstetrics and Gynecology

## 2023-01-24 ENCOUNTER — Ambulatory Visit (INDEPENDENT_AMBULATORY_CARE_PROVIDER_SITE_OTHER): Payer: BC Managed Care – PPO | Admitting: Obstetrics and Gynecology

## 2023-01-24 VITALS — BP 116/82 | HR 107 | Ht 63.0 in | Wt 166.1 lb

## 2023-01-24 DIAGNOSIS — N939 Abnormal uterine and vaginal bleeding, unspecified: Secondary | ICD-10-CM | POA: Diagnosis not present

## 2023-01-24 DIAGNOSIS — D250 Submucous leiomyoma of uterus: Secondary | ICD-10-CM | POA: Diagnosis not present

## 2023-01-24 DIAGNOSIS — Z9289 Personal history of other medical treatment: Secondary | ICD-10-CM

## 2023-01-24 DIAGNOSIS — Z1231 Encounter for screening mammogram for malignant neoplasm of breast: Secondary | ICD-10-CM

## 2023-01-24 DIAGNOSIS — D251 Intramural leiomyoma of uterus: Secondary | ICD-10-CM | POA: Diagnosis not present

## 2023-01-24 DIAGNOSIS — D500 Iron deficiency anemia secondary to blood loss (chronic): Secondary | ICD-10-CM | POA: Diagnosis not present

## 2023-01-24 DIAGNOSIS — N924 Excessive bleeding in the premenopausal period: Secondary | ICD-10-CM

## 2023-01-24 DIAGNOSIS — Z01419 Encounter for gynecological examination (general) (routine) without abnormal findings: Secondary | ICD-10-CM

## 2023-01-24 LAB — POCT HEMOGLOBIN: Hemoglobin: 7.2 g/dL — AB (ref 11–14.6)

## 2023-01-24 MED ORDER — FUSION PLUS PO CAPS: 1.0000 | ORAL_CAPSULE | Freq: Every day | ORAL | 11 refills | Status: AC

## 2023-01-24 MED ORDER — NORETHINDRONE ACETATE 5 MG PO TABS: 10.0000 mg | ORAL_TABLET | Freq: Every day | ORAL | 3 refills | Status: DC

## 2023-01-26 LAB — SURGICAL PATHOLOGY

## 2023-02-13 ENCOUNTER — Encounter: Payer: Commercial Managed Care - PPO | Admitting: Obstetrics and Gynecology

## 2023-04-12 ENCOUNTER — Telehealth: Payer: Self-pay | Admitting: Obstetrics and Gynecology

## 2023-04-12 ENCOUNTER — Encounter: Payer: Self-pay | Admitting: Oncology

## 2023-04-12 MED ORDER — NORETHINDRONE ACETATE 5 MG PO TABS: 10.0000 mg | ORAL_TABLET | Freq: Every day | ORAL | 3 refills | Status: DC

## 2023-04-12 NOTE — Telephone Encounter (Signed)
I have sent in the prescription. If she desires to do something more for her bleeding, she can come in to make an appointment.

## 2023-04-12 NOTE — Telephone Encounter (Signed)
Patient stated that you gave her a medication to stop vag bleeding it was working and now it has came back.  Please advise.

## 2023-04-26 ENCOUNTER — Other Ambulatory Visit: Payer: Self-pay

## 2023-04-26 ENCOUNTER — Encounter: Payer: Self-pay | Admitting: Oncology

## 2023-04-26 ENCOUNTER — Encounter: Payer: Self-pay | Admitting: Obstetrics and Gynecology

## 2023-04-26 ENCOUNTER — Ambulatory Visit: Payer: BC Managed Care – PPO | Admitting: Obstetrics and Gynecology

## 2023-04-26 VITALS — BP 163/103 | HR 86 | Resp 16 | Ht 63.0 in | Wt 172.1 lb

## 2023-04-26 DIAGNOSIS — Z1211 Encounter for screening for malignant neoplasm of colon: Secondary | ICD-10-CM

## 2023-04-26 DIAGNOSIS — D250 Submucous leiomyoma of uterus: Secondary | ICD-10-CM | POA: Diagnosis not present

## 2023-04-26 DIAGNOSIS — D251 Intramural leiomyoma of uterus: Secondary | ICD-10-CM

## 2023-04-26 DIAGNOSIS — N924 Excessive bleeding in the premenopausal period: Secondary | ICD-10-CM | POA: Diagnosis not present

## 2023-04-26 MED ORDER — NORETHINDRONE ACETATE 5 MG PO TABS: 10.0000 mg | ORAL_TABLET | Freq: Every day | ORAL | 6 refills | Status: DC

## 2023-04-26 MED ORDER — NORETHINDRONE ACETATE 5 MG PO TABS: 10.0000 mg | ORAL_TABLET | Freq: Every day | ORAL | 3 refills | Status: DC

## 2023-04-26 MED ORDER — NORETHINDRONE ACETATE 5 MG PO TABS
10.0000 mg | ORAL_TABLET | Freq: Every day | ORAL | 6 refills | Status: DC
Start: 2023-04-26 — End: 2023-07-06
  Filled 2023-04-26: qty 60, 30d supply, fill #0
  Filled 2023-05-21: qty 60, 30d supply, fill #1
  Filled 2023-06-13: qty 60, 30d supply, fill #2

## 2023-04-26 NOTE — Addendum Note (Signed)
Addended by: Tommie Raymond on: 04/26/2023 04:51 PM   Modules accepted: Orders

## 2023-04-26 NOTE — Progress Notes (Signed)
    GYNECOLOGY PROGRESS NOTE  Subjective:    Patient ID: Connie Santiago, female    DOB: 04/27/1966, 57 y.o.   MRN: [id number]  HPI  Patient is a 56 y.o. [id number] female who presents for perimenopausal abnormal uterine bleeding.  She was seen in August after an emergency room visit due to significant bleeding and anemia.  At that time she was started on Aygestin and noted that she had been doing well up until recently.  She had been taking Aygestin 10 mg daily to control her bleeding.  She started back having abnormal bleeding when her prescription was changed to [facility] by the pharmacist.  She has been having bleeding and cramping with clots that comes and goes, but she spots daily. She has undergone workup with an endometrial biopsy done on 01/24/2023 which was normal and an ultrasound that noted a 2.5 cm fundal fibroid.    The following portions of the patient's history were reviewed and updated as appropriate: allergies, current medications, past family history, past medical history, past social history, past surgical history, and problem list.  Review of Systems Pertinent items noted in HPI and remainder of comprehensive ROS otherwise negative.   Objective:   Blood pressure (!) 163/103, pulse 86, resp. rate 16, height 5\' 3"  (1.6 m), weight 172 lb 1.6 oz (78.1 kg).  Repeat BP 137/98  Body mass index is 30.49 kg/m. General appearance: alert and no distress Remainder of exam deferred   Assessment:   1. Perimenopausal menorrhagia   2. Intramural and submucous leiomyoma of uterus      Plan:   -Discussed options of resubmitting patient's prescription as DAW, as she feels that the change to this other generic version of her Aygestin has caused her to start bleeding again.  I discussed that this is the possibility as some patients may be more sensitive to subtle changes in the prescriptions than others.  Advised that if her bleeding does not become better controlled after switching  back to her original Aygestin prescription, may need to do consider other alternative forms of treatment including IUD, Megace, or surgical interventions with endometrial ablation, fibroid embolization, or hysterectomy.  Patient notes understanding.  Call or return to clinic prn if these symptoms worsen or fail to improve as anticipated.    Hildred Laser, MD [facility]

## 2023-05-11 ENCOUNTER — Encounter: Payer: Self-pay | Admitting: Oncology

## 2023-05-18 ENCOUNTER — Ambulatory Visit: Payer: BC Managed Care – PPO | Admitting: Family

## 2023-05-18 ENCOUNTER — Ambulatory Visit: Payer: BC Managed Care – PPO | Admitting: Nurse Practitioner

## 2023-06-20 ENCOUNTER — Other Ambulatory Visit: Payer: Self-pay

## 2023-06-20 ENCOUNTER — Telehealth: Payer: Self-pay

## 2023-06-20 MED ORDER — MEGESTROL ACETATE 40 MG PO TABS
40.0000 mg | ORAL_TABLET | Freq: Two times a day (BID) | ORAL | 11 refills | Status: DC
  Filled 2023-06-20 – 2023-06-21 (×2): qty 30, 15d supply, fill #0

## 2023-06-20 NOTE — Telephone Encounter (Signed)
 Please advise that either I can change her to a different medication, or she can return for discussion of other management options for her perimenopausal bleeding and fibroids.  If she desires a different medication, can prescribe Megace  40 mg BID for 1 year.

## 2023-06-20 NOTE — Telephone Encounter (Signed)
 Patient calling triage wanting to let Dr. Denman Fischer know birth control she was put on to help with bleeding is no longer working. She said the irregular vaginal bleeding started in December and tried taking the birth control every 6 hours (like before) but it's not doing anything. Please advise.

## 2023-06-20 NOTE — Telephone Encounter (Signed)
 Pt aware. Would like dif Rx sent. Rx sent to [facility] per patient.

## 2023-06-21 ENCOUNTER — Other Ambulatory Visit: Payer: Self-pay

## 2023-07-06 ENCOUNTER — Telehealth: Payer: Self-pay

## 2023-07-06 ENCOUNTER — Other Ambulatory Visit: Payer: Self-pay

## 2023-07-06 MED ORDER — NORETHINDRONE ACETATE 5 MG PO TABS
10.0000 mg | ORAL_TABLET | Freq: Every day | ORAL | 6 refills | Status: AC
  Filled 2023-07-06: qty 60, 30d supply, fill #0
  Filled 2023-07-13 – 2023-07-16 (×4): qty 60, 30d supply, fill #1
  Filled 2023-08-12: qty 60, 30d supply, fill #2
  Filled 2023-09-10: qty 60, 30d supply, fill #3
  Filled 2023-10-09: qty 60, 30d supply, fill #4
  Filled 2023-11-06: qty 60, 30d supply, fill #5
  Filled 2023-12-05: qty 60, 30d supply, fill #6
  Filled 2024-01-04: qty 60, 30d supply, fill #7

## 2023-07-06 NOTE — Telephone Encounter (Signed)
Patient called with concerns that she has started back bleeding again. Megace did not work for her, so she went back on aygestin, she reports that this does help but she is out of medication. She wants to know if you want her to continue taking the Aygestin. I sent a refill to her pharmacy.  Please advise.

## 2023-07-09 NOTE — Telephone Encounter (Signed)
That's fine. If her bleeding improved with the Aygestin at this time she can remain on that.

## 2023-07-13 ENCOUNTER — Other Ambulatory Visit (HOSPITAL_COMMUNITY): Payer: Self-pay

## 2023-07-13 ENCOUNTER — Other Ambulatory Visit: Payer: Self-pay

## 2023-07-16 ENCOUNTER — Encounter: Payer: Self-pay | Admitting: Oncology

## 2023-07-16 ENCOUNTER — Other Ambulatory Visit: Payer: Self-pay

## 2023-07-16 MED ORDER — NORETHINDRONE ACETATE 5 MG PO TABS
ORAL_TABLET | ORAL | 0 refills | Status: DC
  Filled 2023-07-16: qty 126, 28d supply, fill #0

## 2023-07-19 ENCOUNTER — Encounter: Payer: Self-pay | Admitting: Family

## 2023-07-19 ENCOUNTER — Ambulatory Visit (INDEPENDENT_AMBULATORY_CARE_PROVIDER_SITE_OTHER): Payer: BC Managed Care – PPO | Admitting: Family

## 2023-07-19 VITALS — BP 178/90 | HR 88 | Ht 63.0 in | Wt 175.6 lb

## 2023-07-19 DIAGNOSIS — Z9189 Other specified personal risk factors, not elsewhere classified: Secondary | ICD-10-CM

## 2023-07-19 DIAGNOSIS — E538 Deficiency of other specified B group vitamins: Secondary | ICD-10-CM

## 2023-07-19 DIAGNOSIS — R7303 Prediabetes: Secondary | ICD-10-CM

## 2023-07-19 DIAGNOSIS — I10 Essential (primary) hypertension: Secondary | ICD-10-CM

## 2023-07-19 DIAGNOSIS — E559 Vitamin D deficiency, unspecified: Secondary | ICD-10-CM

## 2023-07-19 DIAGNOSIS — R5383 Other fatigue: Secondary | ICD-10-CM

## 2023-07-19 DIAGNOSIS — R0789 Other chest pain: Secondary | ICD-10-CM

## 2023-07-19 DIAGNOSIS — D500 Iron deficiency anemia secondary to blood loss (chronic): Secondary | ICD-10-CM

## 2023-07-19 DIAGNOSIS — M79604 Pain in right leg: Secondary | ICD-10-CM

## 2023-07-19 DIAGNOSIS — M79605 Pain in left leg: Secondary | ICD-10-CM

## 2023-07-19 DIAGNOSIS — E782 Mixed hyperlipidemia: Secondary | ICD-10-CM

## 2023-07-19 NOTE — Progress Notes (Signed)
Established Patient Office Visit  Subjective:  Patient ID: Connie Santiago, female    DOB: [birth year]  Age: 58 y.o. MRN: [id number]  Chief Complaint  Patient presents with   Follow-up   Leg Pain   Shortness of Breath    Pain in her legs, sits a lot at work when she's not running around.   Started OCP as a result of her periods coming back in August, has been having elevated blood pressures, headaches   Numbness in toes    Leg Pain  The incident occurred more than 1 week ago. There was no injury mechanism. The quality of the pain is described as cramping. The pain is severe. The pain has been Fluctuating since onset. She reports no foreign bodies present. The symptoms are aggravated by movement and weight bearing. She has tried acetaminophen, elevation, ice, heat, rest and non-weight bearing for the symptoms.  Shortness of Breath The current episode started 1 to 4 weeks ago. The problem occurs intermittently. The problem has been waxing and waning. Associated symptoms include leg pain. The symptoms are aggravated by exercise. Risk factors include oral contraceptive. She has tried cool air, body position changes and rest for the symptoms. The treatment provided no relief.    No other concerns at this time.   Past Medical History:  Diagnosis Date   Anemia    Asthma in child    Cervical prolapse    Fibroid    Iron deficiency anemia due to chronic blood loss 11/21/2017   Swollen R knee     Past Surgical History:  Procedure Laterality Date   NO PAST SURGERIES      Social History   Socioeconomic History   Marital status: Married    Spouse name: Not on file   Number of children: Not on file   Years of education: Not on file   Highest education level: Not on file  Occupational History   Not on file  Tobacco Use   Smoking status: Never   Smokeless tobacco: Never  Substance and Sexual Activity   Alcohol use: No    Alcohol/week: 0.0 standard drinks of alcohol    Drug use: No   Sexual activity: Yes    Birth control/protection: Condom  Other Topics Concern   Not on file  Social History Narrative   Not on file   Social Drivers of Health   Financial Resource Strain: Not on file  Food Insecurity: Not on file  Transportation Needs: Not on file  Physical Activity: Not on file  Stress: Not on file  Social Connections: Not on file  Intimate Partner Violence: Not on file    Family History  Problem Relation Age of Onset   Heart failure Maternal Grandmother    Heart disease Maternal Grandmother    Breast cancer Neg Hx    Ovarian cancer Neg Hx    Colon cancer Neg Hx     Allergies  Allergen Reactions   Erythromycin Itching   Penicillins Itching    SOB  SOB  SOB    Review of Systems  Respiratory:  Positive for shortness of breath.   Cardiovascular:        Chest pressure  Musculoskeletal:        Leg pain       Objective:   BP (!) 178/90   Pulse 88   Ht 5\' 3"  (1.6 m)   Wt 175 lb 9.6 oz (79.7 kg)   SpO2 98%   BMI 31.11  kg/m   Vitals:   07/19/23 1115  BP: (!) 178/90  Pulse: 88  Height: 5\' 3"  (1.6 m)  Weight: 175 lb 9.6 oz (79.7 kg)  SpO2: 98%  BMI (Calculated): 31.11    Physical Exam   No results found for any visits on 07/19/23.  No results found for this or any previous visit (from the past 2160 hours).     Assessment & Plan:   Problem List Items Addressed This Visit       Other   Iron deficiency anemia due to chronic blood loss   Relevant Orders   Iron, TIBC and Ferritin Panel   Vitamin D deficiency   Relevant Orders   VITAMIN D 25 Hydroxy (Vit-D Deficiency, Fractures)   CMP14+EGFR   CBC with Diff   Other Visit Diagnoses       Chest pressure    -  Primary   EKG in office today shows NSR, similar to previous EKG.   Relevant Orders   EKG 12-Lead   CMP14+EGFR   CBC with Diff   D-dimer, quantitative (not at [facility])   Protime-INR     Leg pain, posterior, left       Relevant Orders   CMP14+EGFR    CBC with Diff   D-dimer, quantitative (not at [facility])   Protime-INR     Leg pain, posterior, right       Relevant Orders   CMP14+EGFR   CBC with Diff   D-dimer, quantitative (not at [facility])   Protime-INR     At risk for disorder due to contraceptive use       Relevant Orders   CMP14+EGFR   CBC with Diff   D-dimer, quantitative (not at [facility])   Protime-INR     B12 deficiency due to diet       Checking labs today.  Will continue supplements as needed.   Relevant Orders   CMP14+EGFR   Vitamin B12   CBC with Diff     Prediabetes       A1C is in prediabetic ranges. Patient counseled on dietary choices and verbalized understanding. Will reassess at follow up after next lab check.   Relevant Orders   CMP14+EGFR   Hemoglobin A1c   CBC with Diff     Mixed hyperlipidemia       Checking labs today.  Continue current therapy for lipid control. Will modify as needed based on labwork results.   Relevant Orders   Lipid panel   CMP14+EGFR   CBC with Diff     Other fatigue       Relevant Orders   CMP14+EGFR   TSH   CBC with Diff     Essential hypertension, benign       Blood pressure well controlled with current medications.  Continue current therapy.  Will reassess at follow up.   Relevant Orders   CMP14+EGFR   CBC with Diff      Will also get D-dimer and PT INR for patient today. Will call with results when available.   Return to be determined by lab results.   Total time spent: 20 minutes  Miki Kins, FNP  07/19/2023   This document may have been prepared by [facility] Voice Recognition software and as such may include unintentional dictation errors.

## 2023-07-20 ENCOUNTER — Encounter: Payer: Self-pay | Admitting: Family

## 2023-07-20 DIAGNOSIS — D509 Iron deficiency anemia, unspecified: Secondary | ICD-10-CM

## 2023-07-20 LAB — CMP14+EGFR
ALT: 133 [IU]/L — ABNORMAL HIGH (ref 0–32)
AST: 53 [IU]/L — ABNORMAL HIGH (ref 0–40)
Albumin: 4 g/dL (ref 3.8–4.9)
Alkaline Phosphatase: 26 [IU]/L — ABNORMAL LOW (ref 44–121)
BUN/Creatinine Ratio: 8 — ABNORMAL LOW (ref 9–23)
BUN: 6 mg/dL (ref 6–24)
Bilirubin Total: 0.3 mg/dL (ref 0.0–1.2)
CO2: 21 mmol/L (ref 20–29)
Calcium: 8.8 mg/dL (ref 8.7–10.2)
Chloride: 101 mmol/L (ref 96–106)
Creatinine, Ser: 0.78 mg/dL (ref 0.57–1.00)
Globulin, Total: 2.9 g/dL (ref 1.5–4.5)
Glucose: 93 mg/dL (ref 70–99)
Potassium: 4 mmol/L (ref 3.5–5.2)
Sodium: 136 mmol/L (ref 134–144)
Total Protein: 6.9 g/dL (ref 6.0–8.5)
eGFR: 89 mL/min/{1.73_m2} (ref >59)

## 2023-07-20 LAB — LIPID PANEL
Chol/HDL Ratio: 8.5 {ratio} — ABNORMAL HIGH (ref 0.0–4.4)
Cholesterol, Total: 203 mg/dL — ABNORMAL HIGH (ref 100–199)
HDL: 24 mg/dL — ABNORMAL LOW (ref >39)
LDL Chol Calc (NIH): 166 mg/dL — ABNORMAL HIGH (ref 0–99)
Triglycerides: 73 mg/dL (ref 0–149)
VLDL Cholesterol Cal: 13 mg/dL (ref 5–40)

## 2023-07-20 LAB — CBC WITH DIFFERENTIAL/PLATELET
Basophils Absolute: 0 10*3/uL (ref 0.0–0.2)
Basos: 1 %
EOS (ABSOLUTE): 0.2 10*3/uL (ref 0.0–0.4)
Eos: 3 %
Hematocrit: 32.7 % — ABNORMAL LOW (ref 34.0–46.6)
Hemoglobin: 9.4 g/dL — ABNORMAL LOW (ref 11.1–15.9)
Immature Grans (Abs): 0 10*3/uL (ref 0.0–0.1)
Immature Granulocytes: 0 %
Lymphocytes Absolute: 1.5 10*3/uL (ref 0.7–3.1)
Lymphs: 26 %
MCH: 22.3 pg — ABNORMAL LOW (ref 26.6–33.0)
MCHC: 28.7 g/dL — ABNORMAL LOW (ref 31.5–35.7)
MCV: 78 fL — ABNORMAL LOW (ref 79–97)
Monocytes Absolute: 0.4 10*3/uL (ref 0.1–0.9)
Monocytes: 8 %
Neutrophils Absolute: 3.5 10*3/uL (ref 1.4–7.0)
Neutrophils: 62 %
Platelets: 502 10*3/uL — ABNORMAL HIGH (ref 150–450)
RBC: 4.22 x10E6/uL (ref 3.77–5.28)
RDW: 15.8 % — ABNORMAL HIGH (ref 11.7–15.4)
WBC: 5.6 10*3/uL (ref 3.4–10.8)

## 2023-07-20 LAB — HEMOGLOBIN A1C
Est. average glucose Bld gHb Est-mCnc: 117 mg/dL
Hgb A1c MFr Bld: 5.7 % — ABNORMAL HIGH (ref 4.8–5.6)

## 2023-07-20 LAB — D-DIMER, QUANTITATIVE: D-DIMER: 0.36 mg{FEU}/L (ref 0.00–0.49)

## 2023-07-20 LAB — IRON,TIBC AND FERRITIN PANEL
Ferritin: 8 ng/mL — ABNORMAL LOW (ref 15–150)
Iron Saturation: 4 % — CL (ref 15–55)
Iron: 21 ug/dL — ABNORMAL LOW (ref 27–159)
Total Iron Binding Capacity: 565 ug/dL (ref 250–450)
UIBC: 544 ug/dL — ABNORMAL HIGH (ref 131–425)

## 2023-07-20 LAB — VITAMIN B12: Vitamin B-12: 632 pg/mL (ref 232–1245)

## 2023-07-20 LAB — PROTIME-INR
INR: 1 (ref 0.9–1.2)
Prothrombin Time: 11.4 s (ref 9.1–12.0)

## 2023-07-20 LAB — VITAMIN D 25 HYDROXY (VIT D DEFICIENCY, FRACTURES): Vit D, 25-Hydroxy: 10.1 ng/mL — ABNORMAL LOW (ref 30.0–100.0)

## 2023-07-20 LAB — TSH: TSH: 1.88 u[IU]/mL (ref 0.450–4.500)

## 2023-07-20 NOTE — Telephone Encounter (Signed)
Printed for signature, will inform patient once its been signed

## 2023-08-13 ENCOUNTER — Other Ambulatory Visit: Payer: Self-pay

## 2023-09-04 ENCOUNTER — Encounter: Payer: Self-pay | Admitting: Family

## 2023-09-14 MED ORDER — NYSTATIN 100000 UNIT/ML MT SUSP: 5.0000 mL | Freq: Four times a day (QID) | OROMUCOSAL | 0 refills | Status: DC

## 2023-09-14 NOTE — Addendum Note (Signed)
 Addended byKatherine Mantle on: 09/14/2023 09:52 AM   Modules accepted: Orders

## 2023-12-13 ENCOUNTER — Other Ambulatory Visit (HOSPITAL_COMMUNITY)
Admission: RE | Admit: 2023-12-13 | Discharge: 2023-12-13 | Disposition: A | Discharge status: Home / Self Care | Source: Ambulatory Visit | Attending: Obstetrics and Gynecology | Admitting: Obstetrics and Gynecology

## 2023-12-13 ENCOUNTER — Ambulatory Visit: Admitting: Obstetrics and Gynecology

## 2023-12-13 ENCOUNTER — Encounter: Payer: Self-pay | Admitting: Obstetrics and Gynecology

## 2023-12-13 ENCOUNTER — Ambulatory Visit

## 2023-12-13 VITALS — BP 136/78 | HR 98 | Ht 63.0 in | Wt 179.0 lb

## 2023-12-13 DIAGNOSIS — Z1151 Encounter for screening for human papillomavirus (HPV): Secondary | ICD-10-CM | POA: Diagnosis present

## 2023-12-13 DIAGNOSIS — N939 Abnormal uterine and vaginal bleeding, unspecified: Secondary | ICD-10-CM

## 2023-12-13 DIAGNOSIS — Z124 Encounter for screening for malignant neoplasm of cervix: Secondary | ICD-10-CM

## 2023-12-13 DIAGNOSIS — N898 Other specified noninflammatory disorders of vagina: Secondary | ICD-10-CM

## 2023-12-13 DIAGNOSIS — N83202 Unspecified ovarian cyst, left side: Secondary | ICD-10-CM

## 2023-12-13 LAB — POCT WET PREP WITH KOH
Clue Cells Wet Prep HPF POC: NEGATIVE
KOH Prep POC: NEGATIVE
Trichomonas, UA: NEGATIVE
Yeast Wet Prep HPF POC: NEGATIVE

## 2023-12-13 NOTE — Patient Instructions (Signed)
 I value your feedback and you entrusting [place] with your care. If you get a [place] patient survey, I would appreciate you taking the time to let us know about your experience today. Thank you! ? ? ?

## 2023-12-13 NOTE — Progress Notes (Signed)
 Orlean Alan HERO, FNP   Chief Complaint  Patient presents with   Possible Retained Tampon    Since 6/29, vag discharge and odor x 2 days. Cycle that started 01/2023 is still going on.    HPI:      Ms. Connie Santiago is a 58 y.o. [id number] whose LMP was Patient's last menstrual period was 01/04/2023 (approximate)., presents today for possible retained tampon. Has had a vaginal d/c with non-fishy odor for a few days but doesn't feel tampon inside. Pt still having periods. Hx of AUB due to leio on GYN u/s 8/24, had neg EMB. Started on aygestin  5 mg without sx control, now on 10 mg daily dose. Still has daily bleeding, either spotting or gush of flow with clots but heavy days are lighter now compared to last yr. Tried megace  prior to aygestin  without relief. No VS sx. FH leio/hyst in sisters.  Neg pap 1/18. She is sexually active, no pain/bleeding.   8/24 GYN U/S IMPRESSION: 1. 2.5 cm intramural/submucosal uterine fibroid with possible intracavitary extension. 2. 9 mm endometrial stripe. In the setting of post-menopausal bleeding, endometrial sampling is indicated to exclude carcinoma. If results are benign, sonohysterogram should be considered for focal lesion work-up. (Ref: Radiological Reasoning: Algorithmic Workup of Abnormal Vaginal Bleeding with Endovaginal Sonography and Sonohysterography. AJR 2008; 808:D31-26) 3. Left ovarian benign functional cyst measuring 3.3 cm. Recommend follow-up pelvic ultrasound in 3-6 months. Reference: Radiology 2019 Nov;293(2):359-371 4. Nonvisualization of the right ovary. No adnexal mass.  Past Medical History:  Diagnosis Date   Anemia    Asthma in child    Cervical prolapse    Fibroid    Iron  deficiency anemia due to chronic blood loss 11/21/2017   Swollen R knee     Past Surgical History:  Procedure Laterality Date   NO PAST SURGERIES      Family History  Problem Relation Age of Onset   Heart failure Maternal Grandmother     Heart disease Maternal Grandmother    Breast cancer Neg Hx    Ovarian cancer Neg Hx    Colon cancer Neg Hx     Social History   Socioeconomic History   Marital status: Married    Spouse name: Not on file   Number of children: Not on file   Years of education: Not on file   Highest education level: Not on file  Occupational History   Not on file  Tobacco Use   Smoking status: Never   Smokeless tobacco: Never  Substance and Sexual Activity   Alcohol use: No    Alcohol/week: 0.0 standard drinks of alcohol   Drug use: No   Sexual activity: Yes    Birth control/protection: Condom  Other Topics Concern   Not on file  Social History Narrative   Not on file   Social Drivers of Health   Financial Resource Strain: Not on file  Food Insecurity: Not on file  Transportation Needs: Not on file  Physical Activity: Not on file  Stress: Not on file  Social Connections: Not on file  Intimate Partner Violence: Not on file    Outpatient Medications Prior to Visit  Medication Sig Dispense Refill   Iron -FA-B Cmp-C-Biot-Probiotic (FUSION PLUS) CAPS Take 1 capsule by mouth daily at 6 (six) AM. 90 capsule 11   norethindrone  (AYGESTIN ) 5 MG tablet Take 2 tablets (10 mg total) by mouth daily. 120 tablet 6   megestrol  (MEGACE ) 40 MG tablet Take 1 tablet (40 mg  total) by mouth 2 (two) times daily. (Patient not taking: Reported on 07/19/2023) 60 tablet 11   norethindrone  (AYGESTIN ) 5 MG tablet Take 2 tablets (10 mg total) by mouth every 8 (eight) hours for 7 days, THEN 2 tablets (10 mg total) every 12 (twelve) hours for 21 days. 126 tablet 0   nystatin  (MYCOSTATIN ) 100000 UNIT/ML suspension Take 5 mLs (500,000 Units total) by mouth 4 (four) times daily. 200 mL 0   No facility-administered medications prior to visit.      ROS:  Review of Systems  Constitutional:  Negative for fever.  Gastrointestinal:  Negative for blood in stool, constipation, diarrhea, nausea and vomiting.   Genitourinary:  Positive for menstrual problem and vaginal discharge. Negative for dyspareunia, dysuria, flank pain, frequency, hematuria, urgency, vaginal bleeding and vaginal pain.  Musculoskeletal:  Negative for back pain.  Skin:  Negative for rash.   BREAST: No symptoms   OBJECTIVE:   Vitals:  BP 136/78   Pulse 98   Ht 5' 3 (1.6 m)   Wt 179 lb (81.2 kg)   LMP 01/04/2023 (Approximate)   BMI 31.71 kg/m   Physical Exam Vitals reviewed.  Constitutional:      Appearance: She is well-developed.  Pulmonary:     Effort: Pulmonary effort is normal.  Genitourinary:    General: Normal vulva.     Pubic Area: No rash.      Labia:        Right: No rash, tenderness or lesion.        Left: No rash, tenderness or lesion.      Vagina: Normal. No vaginal discharge, erythema or tenderness.     Cervix: Normal.     Uterus: Normal. Tender. Not enlarged.      Adnexa: Right adnexa normal and left adnexa normal.       Right: No mass or tenderness.         Left: No mass or tenderness.       Comments: HAS LARGE CX, VERY VASCULAR; NOT FRIABLE; HX OF NABOTHIAN CYSTS Musculoskeletal:        General: Normal range of motion.     Cervical back: Normal range of motion.  Skin:    General: Skin is warm and dry.  Neurological:     General: No focal deficit present.     Mental Status: She is alert and oriented to person, place, and time.  Psychiatric:        Mood and Affect: Mood normal.        Behavior: Behavior normal.        Thought Content: Thought content normal.        Judgment: Judgment normal.     Results: Results for orders placed or performed in visit on 12/13/23 (from the past 24 hours)  POCT Wet Prep with KOH     Status: Normal   Collection Time: 12/13/23 11:56 AM  Result Value Ref Range   Trichomonas, UA Negative    Clue Cells Wet Prep HPF POC neg    Epithelial Wet Prep HPF POC     Yeast Wet Prep HPF POC neg    Bacteria Wet Prep HPF POC     RBC Wet Prep HPF POC     WBC  Wet Prep HPF POC     KOH Prep POC Negative Negative     Assessment/Plan: Abnormal uterine bleeding (AUB) - Plan: US  PELVIS TRANSVAGINAL NON-OB (TV ONLY); hx of leio, check pap and GYN u/s and will f/u  with results. Discussed UFE, myomectomy, hyst and ref to MD  Left ovarian cyst - Plan: US  PELVIS TRANSVAGINAL NON-OB (TV ONLY); f/u on GYN u/s  Vaginal discharge - Plan: POCT Wet Prep with KOH; neg exam and wet prep. Reassurance. No evidence of retained tampon.   Cervical cancer screening - Plan: Cytology - PAP  Screening for HPV (human papillomavirus) - Plan: Cytology - PAP   Return in about 2 days (around 12/15/2023) for GYn u/s for LTO cyst f/u/leio/AUB--[facility] to call pt.  Basilia Stuckert B. Brady Plant, PA-C 12/13/2023 11:57 AM

## 2023-12-18 ENCOUNTER — Telehealth: Payer: Self-pay | Admitting: Obstetrics and Gynecology

## 2023-12-18 DIAGNOSIS — N939 Abnormal uterine and vaginal bleeding, unspecified: Secondary | ICD-10-CM

## 2023-12-18 DIAGNOSIS — Z780 Asymptomatic menopausal state: Secondary | ICD-10-CM

## 2023-12-18 LAB — CYTOLOGY - PAP
Comment: NEGATIVE
Diagnosis: NEGATIVE
High risk HPV: NEGATIVE

## 2023-12-18 NOTE — Telephone Encounter (Signed)
 Pt aware of small leio on GYN u/s. Unsure if AUB is from [facility], perimenopause, or atrophy from aygestin . Pt to stop aygestin , aware may bring on period in next 1-10 days, then follow cycles. If bleeding ok, will check FSH/LH/estradiol in 2 months (orders placed). If postmenopausal, then bleeding prob related to leio and can do tx. If not postmenopausal, then can manage bleeding either with prog options vs follow, depending on flow/sx. Pt agrees to this plan.

## 2023-12-21 ENCOUNTER — Encounter: Payer: Self-pay | Admitting: Obstetrics and Gynecology

## 2023-12-21 DIAGNOSIS — D219 Benign neoplasm of connective and other soft tissue, unspecified: Secondary | ICD-10-CM

## 2024-02-27 ENCOUNTER — Encounter: Payer: Self-pay | Admitting: Oncology
